# Patient Record
Sex: Male | Born: 1965 | Race: Black or African American | Hispanic: No | Marital: Married | Smoking: Never smoker
Health system: Southern US, Community
[De-identification: ages and names within clinical notes are randomized; demographics above are authoritative.]

## PROBLEM LIST (undated history)

## (undated) DIAGNOSIS — I10 Essential (primary) hypertension: Secondary | ICD-10-CM

## (undated) DIAGNOSIS — E119 Type 2 diabetes mellitus without complications: Secondary | ICD-10-CM

---

## 2012-04-15 DIAGNOSIS — M1710 Unilateral primary osteoarthritis, unspecified knee: Secondary | ICD-10-CM | POA: Insufficient documentation

## 2012-04-15 DIAGNOSIS — Z96649 Presence of unspecified artificial hip joint: Secondary | ICD-10-CM | POA: Insufficient documentation

## 2014-03-11 DIAGNOSIS — I10 Essential (primary) hypertension: Secondary | ICD-10-CM | POA: Insufficient documentation

## 2014-06-24 DIAGNOSIS — N529 Male erectile dysfunction, unspecified: Secondary | ICD-10-CM | POA: Insufficient documentation

## 2014-06-24 DIAGNOSIS — D1720 Benign lipomatous neoplasm of skin and subcutaneous tissue of unspecified limb: Secondary | ICD-10-CM | POA: Insufficient documentation

## 2015-01-18 DIAGNOSIS — N184 Chronic kidney disease, stage 4 (severe): Secondary | ICD-10-CM | POA: Insufficient documentation

## 2015-05-07 DIAGNOSIS — E66811 Obesity, class 1: Secondary | ICD-10-CM | POA: Diagnosis present

## 2015-05-07 DIAGNOSIS — E669 Obesity, unspecified: Secondary | ICD-10-CM | POA: Diagnosis present

## 2015-08-25 DIAGNOSIS — E785 Hyperlipidemia, unspecified: Secondary | ICD-10-CM | POA: Diagnosis present

## 2015-08-25 DIAGNOSIS — R739 Hyperglycemia, unspecified: Secondary | ICD-10-CM | POA: Diagnosis present

## 2015-12-06 DIAGNOSIS — E1121 Type 2 diabetes mellitus with diabetic nephropathy: Secondary | ICD-10-CM | POA: Insufficient documentation

## 2016-03-24 DIAGNOSIS — K219 Gastro-esophageal reflux disease without esophagitis: Secondary | ICD-10-CM | POA: Diagnosis present

## 2016-03-28 DIAGNOSIS — IMO0002 Reserved for concepts with insufficient information to code with codable children: Secondary | ICD-10-CM | POA: Insufficient documentation

## 2016-03-28 DIAGNOSIS — H35033 Hypertensive retinopathy, bilateral: Secondary | ICD-10-CM | POA: Insufficient documentation

## 2016-09-07 DIAGNOSIS — I10 Essential (primary) hypertension: Secondary | ICD-10-CM | POA: Diagnosis present

## 2017-03-27 DIAGNOSIS — E876 Hypokalemia: Secondary | ICD-10-CM | POA: Insufficient documentation

## 2017-07-30 DIAGNOSIS — R0683 Snoring: Secondary | ICD-10-CM | POA: Insufficient documentation

## 2017-09-14 DIAGNOSIS — R0681 Apnea, not elsewhere classified: Secondary | ICD-10-CM | POA: Insufficient documentation

## 2017-11-08 DIAGNOSIS — G4733 Obstructive sleep apnea (adult) (pediatric): Secondary | ICD-10-CM | POA: Diagnosis present

## 2017-12-17 DIAGNOSIS — B351 Tinea unguium: Secondary | ICD-10-CM | POA: Insufficient documentation

## 2020-06-11 DIAGNOSIS — H6122 Impacted cerumen, left ear: Secondary | ICD-10-CM | POA: Insufficient documentation

## 2020-11-08 DIAGNOSIS — F410 Panic disorder [episodic paroxysmal anxiety] without agoraphobia: Secondary | ICD-10-CM | POA: Insufficient documentation

## 2021-01-25 ENCOUNTER — Emergency Department (HOSPITAL_BASED_OUTPATIENT_CLINIC_OR_DEPARTMENT_OTHER): Payer: Medicare Other

## 2021-01-25 ENCOUNTER — Observation Stay (HOSPITAL_BASED_OUTPATIENT_CLINIC_OR_DEPARTMENT_OTHER)
Admission: EM | Admit: 2021-01-25 | Discharge: 2021-01-27 | Disposition: A | Discharge status: Home / Self Care | Payer: Medicare Other | Attending: Student | Admitting: Student

## 2021-01-25 ENCOUNTER — Other Ambulatory Visit: Payer: Self-pay

## 2021-01-25 ENCOUNTER — Encounter (HOSPITAL_BASED_OUTPATIENT_CLINIC_OR_DEPARTMENT_OTHER): Payer: Self-pay

## 2021-01-25 DIAGNOSIS — R748 Abnormal levels of other serum enzymes: Secondary | ICD-10-CM

## 2021-01-25 DIAGNOSIS — R1013 Epigastric pain: Secondary | ICD-10-CM | POA: Diagnosis present

## 2021-01-25 DIAGNOSIS — K7589 Other specified inflammatory liver diseases: Secondary | ICD-10-CM | POA: Insufficient documentation

## 2021-01-25 DIAGNOSIS — K8012 Calculus of gallbladder with acute and chronic cholecystitis without obstruction: Secondary | ICD-10-CM | POA: Diagnosis not present

## 2021-01-25 DIAGNOSIS — K760 Fatty (change of) liver, not elsewhere classified: Secondary | ICD-10-CM

## 2021-01-25 DIAGNOSIS — Z96643 Presence of artificial hip joint, bilateral: Secondary | ICD-10-CM | POA: Diagnosis not present

## 2021-01-25 DIAGNOSIS — R739 Hyperglycemia, unspecified: Secondary | ICD-10-CM | POA: Diagnosis present

## 2021-01-25 DIAGNOSIS — Z20822 Contact with and (suspected) exposure to covid-19: Secondary | ICD-10-CM | POA: Diagnosis not present

## 2021-01-25 DIAGNOSIS — K810 Acute cholecystitis: Secondary | ICD-10-CM | POA: Diagnosis present

## 2021-01-25 DIAGNOSIS — I129 Hypertensive chronic kidney disease with stage 1 through stage 4 chronic kidney disease, or unspecified chronic kidney disease: Secondary | ICD-10-CM | POA: Insufficient documentation

## 2021-01-25 DIAGNOSIS — N184 Chronic kidney disease, stage 4 (severe): Secondary | ICD-10-CM | POA: Insufficient documentation

## 2021-01-25 DIAGNOSIS — K219 Gastro-esophageal reflux disease without esophagitis: Secondary | ICD-10-CM | POA: Diagnosis not present

## 2021-01-25 DIAGNOSIS — E1122 Type 2 diabetes mellitus with diabetic chronic kidney disease: Secondary | ICD-10-CM | POA: Diagnosis not present

## 2021-01-25 DIAGNOSIS — I10 Essential (primary) hypertension: Secondary | ICD-10-CM

## 2021-01-25 DIAGNOSIS — K8000 Calculus of gallbladder with acute cholecystitis without obstruction: Secondary | ICD-10-CM | POA: Diagnosis not present

## 2021-01-25 DIAGNOSIS — E1165 Type 2 diabetes mellitus with hyperglycemia: Secondary | ICD-10-CM | POA: Diagnosis not present

## 2021-01-25 DIAGNOSIS — Z7982 Long term (current) use of aspirin: Secondary | ICD-10-CM | POA: Insufficient documentation

## 2021-01-25 DIAGNOSIS — Z888 Allergy status to other drugs, medicaments and biological substances status: Secondary | ICD-10-CM | POA: Insufficient documentation

## 2021-01-25 DIAGNOSIS — R1011 Right upper quadrant pain: Secondary | ICD-10-CM

## 2021-01-25 DIAGNOSIS — E1121 Type 2 diabetes mellitus with diabetic nephropathy: Secondary | ICD-10-CM

## 2021-01-25 DIAGNOSIS — E785 Hyperlipidemia, unspecified: Secondary | ICD-10-CM | POA: Diagnosis present

## 2021-01-25 DIAGNOSIS — E669 Obesity, unspecified: Secondary | ICD-10-CM | POA: Diagnosis present

## 2021-01-25 DIAGNOSIS — Z79899 Other long term (current) drug therapy: Secondary | ICD-10-CM | POA: Insufficient documentation

## 2021-01-25 DIAGNOSIS — G4733 Obstructive sleep apnea (adult) (pediatric): Secondary | ICD-10-CM | POA: Diagnosis present

## 2021-01-25 HISTORY — DX: Essential (primary) hypertension: I10

## 2021-01-25 HISTORY — DX: Type 2 diabetes mellitus without complications: E11.9

## 2021-01-25 LAB — COMPREHENSIVE METABOLIC PANEL
ALT: 40 U/L (ref 0–44)
AST: 29 U/L (ref 15–41)
Albumin: 4.5 g/dL (ref 3.5–5.0)
Alkaline Phosphatase: 66 U/L (ref 38–126)
Anion gap: 10 (ref 5–15)
BUN: 23 mg/dL — ABNORMAL HIGH (ref 6–20)
CO2: 25 mmol/L (ref 22–32)
Calcium: 9.2 mg/dL (ref 8.9–10.3)
Chloride: 102 mmol/L (ref 98–111)
Creatinine, Ser: 1.91 mg/dL — ABNORMAL HIGH (ref 0.61–1.24)
GFR, Estimated: 41 mL/min — ABNORMAL LOW (ref >60)
Glucose, Bld: 158 mg/dL — ABNORMAL HIGH (ref 70–99)
Potassium: 3.8 mmol/L (ref 3.5–5.1)
Sodium: 137 mmol/L (ref 135–145)
Total Bilirubin: 0.6 mg/dL (ref 0.3–1.2)
Total Protein: 8.2 g/dL — ABNORMAL HIGH (ref 6.5–8.1)

## 2021-01-25 LAB — CBC
HCT: 43.7 % (ref 39.0–52.0)
Hemoglobin: 14.5 g/dL (ref 13.0–17.0)
MCH: 29.1 pg (ref 26.0–34.0)
MCHC: 33.2 g/dL (ref 30.0–36.0)
MCV: 87.8 fL (ref 80.0–100.0)
Platelets: 194 10*3/uL (ref 150–400)
RBC: 4.98 MIL/uL (ref 4.22–5.81)
RDW: 14.7 % (ref 11.5–15.5)
WBC: 5.4 10*3/uL (ref 4.0–10.5)
nRBC: 0 % (ref 0.0–0.2)

## 2021-01-25 LAB — LACTIC ACID, PLASMA: Lactic Acid, Venous: 1 mmol/L (ref 0.5–1.9)

## 2021-01-25 LAB — URINALYSIS, MICROSCOPIC (REFLEX)

## 2021-01-25 LAB — URINALYSIS, ROUTINE W REFLEX MICROSCOPIC
Bilirubin Urine: NEGATIVE
Glucose, UA: 100 mg/dL — AB
Ketones, ur: NEGATIVE mg/dL
Leukocytes,Ua: NEGATIVE
Nitrite: NEGATIVE
Protein, ur: 30 mg/dL — AB
Specific Gravity, Urine: 1.02 (ref 1.005–1.030)
pH: 8 (ref 5.0–8.0)

## 2021-01-25 LAB — RESP PANEL BY RT-PCR (FLU A&B, COVID) ARPGX2
Influenza A by PCR: NEGATIVE
Influenza B by PCR: NEGATIVE
SARS Coronavirus 2 by RT PCR: NEGATIVE

## 2021-01-25 LAB — LIPASE, BLOOD: Lipase: 58 U/L — ABNORMAL HIGH (ref 11–51)

## 2021-01-25 LAB — TROPONIN I (HIGH SENSITIVITY)
Troponin I (High Sensitivity): 10 ng/L (ref <18)
Troponin I (High Sensitivity): 10 ng/L (ref <18)

## 2021-01-25 LAB — CBG MONITORING, ED
Glucose-Capillary: 114 mg/dL — ABNORMAL HIGH (ref 70–99)
Glucose-Capillary: 110 mg/dL — ABNORMAL HIGH (ref 70–99)

## 2021-01-25 MED ORDER — PANTOPRAZOLE SODIUM 40 MG PO TBEC
40.0000 mg | DELAYED_RELEASE_TABLET | Freq: Every day | ORAL | Status: DC
  Administered 2021-01-26 – 2021-01-27 (×2): 40 mg via ORAL
  Filled 2021-01-25 (×2): qty 1

## 2021-01-25 MED ORDER — AMLODIPINE BESYLATE 10 MG PO TABS
10.0000 mg | ORAL_TABLET | Freq: Every day | ORAL | Status: DC
  Administered 2021-01-26 – 2021-01-27 (×2): 10 mg via ORAL
  Filled 2021-01-25 (×2): qty 1

## 2021-01-25 MED ORDER — AMILORIDE HCL 5 MG PO TABS
5.0000 mg | ORAL_TABLET | Freq: Every day | ORAL | Status: DC
  Administered 2021-01-25 – 2021-01-27 (×2): 5 mg via ORAL
  Filled 2021-01-25 (×3): qty 1

## 2021-01-25 MED ORDER — FAMOTIDINE IN NACL 20-0.9 MG/50ML-% IV SOLN
20.0000 mg | Freq: Once | INTRAVENOUS | Status: AC
  Administered 2021-01-25: 20 mg via INTRAVENOUS
  Filled 2021-01-25: qty 50

## 2021-01-25 MED ORDER — ONDANSETRON HCL 4 MG/2ML IJ SOLN
4.0000 mg | Freq: Four times a day (QID) | INTRAMUSCULAR | Status: DC | PRN
  Administered 2021-01-26: 4 mg via INTRAVENOUS
  Filled 2021-01-25: qty 2

## 2021-01-25 MED ORDER — HYDROMORPHONE HCL 1 MG/ML IJ SOLN
0.5000 mg | Freq: Once | INTRAMUSCULAR | Status: AC
  Administered 2021-01-25: 0.5 mg via INTRAVENOUS
  Filled 2021-01-25: qty 1

## 2021-01-25 MED ORDER — LABETALOL HCL 300 MG PO TABS: 300.0000 mg | ORAL_TABLET | Freq: Three times a day (TID) | ORAL | Status: DC

## 2021-01-25 MED ORDER — LABETALOL HCL 5 MG/ML IV SOLN
10.0000 mg | Freq: Once | INTRAVENOUS | Status: AC
  Administered 2021-01-25: 10 mg via INTRAVENOUS

## 2021-01-25 MED ORDER — IOHEXOL 300 MG/ML  SOLN
100.0000 mL | Freq: Once | INTRAMUSCULAR | Status: AC | PRN
  Administered 2021-01-25: 80 mL via INTRAVENOUS

## 2021-01-25 MED ORDER — SODIUM CHLORIDE 0.9% FLUSH
3.0000 mL | Freq: Two times a day (BID) | INTRAVENOUS | Status: DC
  Administered 2021-01-26 – 2021-01-27 (×2): 3 mL via INTRAVENOUS

## 2021-01-25 MED ORDER — LABETALOL HCL 300 MG PO TABS: 300.0000 mg | ORAL_TABLET | Freq: Two times a day (BID) | ORAL | Status: DC

## 2021-01-25 MED ORDER — SODIUM CHLORIDE 0.9 % IV SOLN
2.0000 g | INTRAVENOUS | Status: DC
  Administered 2021-01-25 – 2021-01-26 (×2): 2 g via INTRAVENOUS
  Filled 2021-01-25: qty 20
  Filled 2021-01-25: qty 0.2

## 2021-01-25 MED ORDER — SODIUM CHLORIDE 0.9 % IV BOLUS
1000.0000 mL | Freq: Once | INTRAVENOUS | Status: AC
  Administered 2021-01-25: 1000 mL via INTRAVENOUS

## 2021-01-25 MED ORDER — LABETALOL HCL 5 MG/ML IV SOLN
INTRAVENOUS | Status: AC
  Filled 2021-01-25: qty 4

## 2021-01-25 MED ORDER — SODIUM CHLORIDE 0.9 % IV SOLN: INTRAVENOUS | Status: DC

## 2021-01-25 MED ORDER — INSULIN ASPART 100 UNIT/ML ~~LOC~~ SOLN
0.0000 [IU] | SUBCUTANEOUS | Status: DC
  Filled 2021-01-25: qty 0.15

## 2021-01-25 MED ORDER — LISINOPRIL 20 MG PO TABS: 40.0000 mg | ORAL_TABLET | Freq: Every day | ORAL | Status: DC

## 2021-01-25 MED ORDER — LIDOCAINE VISCOUS HCL 2 % MT SOLN
15.0000 mL | Freq: Once | OROMUCOSAL | Status: AC
  Administered 2021-01-25: 15 mL via ORAL
  Filled 2021-01-25: qty 15

## 2021-01-25 MED ORDER — HYDRALAZINE HCL 20 MG/ML IJ SOLN: 5.0000 mg | Freq: Three times a day (TID) | INTRAMUSCULAR | Status: DC | PRN

## 2021-01-25 MED ORDER — LABETALOL HCL 300 MG PO TABS
300.0000 mg | ORAL_TABLET | Freq: Two times a day (BID) | ORAL | Status: DC
  Administered 2021-01-25 – 2021-01-27 (×4): 300 mg via ORAL
  Filled 2021-01-25 (×4): qty 1

## 2021-01-25 MED ORDER — HYDROMORPHONE HCL 1 MG/ML IJ SOLN
0.5000 mg | INTRAMUSCULAR | Status: DC | PRN
  Administered 2021-01-26: 0.5 mg via INTRAVENOUS
  Filled 2021-01-25: qty 0.5

## 2021-01-25 MED ORDER — AMILORIDE HCL 5 MG PO TABS: 5.0000 mg | ORAL_TABLET | Freq: Every day | ORAL | Status: DC

## 2021-01-25 MED ORDER — AMLODIPINE BESYLATE 5 MG PO TABS: 10.0000 mg | ORAL_TABLET | Freq: Every day | ORAL | Status: DC

## 2021-01-25 MED ORDER — LISINOPRIL 20 MG PO TABS
40.0000 mg | ORAL_TABLET | Freq: Every day | ORAL | Status: DC
  Administered 2021-01-26 – 2021-01-27 (×2): 40 mg via ORAL
  Filled 2021-01-25 (×2): qty 2

## 2021-01-25 MED ORDER — ROSUVASTATIN CALCIUM 10 MG PO TABS
10.0000 mg | ORAL_TABLET | Freq: Every day | ORAL | Status: DC
  Administered 2021-01-25 – 2021-01-27 (×3): 10 mg via ORAL
  Filled 2021-01-25 (×3): qty 1

## 2021-01-25 MED ORDER — ALUM & MAG HYDROXIDE-SIMETH 200-200-20 MG/5ML PO SUSP
30.0000 mL | Freq: Once | ORAL | Status: AC
  Administered 2021-01-25: 30 mL via ORAL
  Filled 2021-01-25: qty 30

## 2021-01-25 MED ORDER — ONDANSETRON HCL 4 MG/2ML IJ SOLN
4.0000 mg | Freq: Once | INTRAMUSCULAR | Status: AC
  Administered 2021-01-25: 4 mg via INTRAVENOUS
  Filled 2021-01-25: qty 2

## 2021-01-25 MED ORDER — MORPHINE SULFATE (PF) 4 MG/ML IV SOLN
4.0000 mg | Freq: Once | INTRAVENOUS | Status: AC
  Administered 2021-01-25: 4 mg via INTRAVENOUS
  Filled 2021-01-25: qty 1

## 2021-01-25 MED ORDER — METRONIDAZOLE IN NACL 5-0.79 MG/ML-% IV SOLN
500.0000 mg | Freq: Three times a day (TID) | INTRAVENOUS | Status: DC
  Administered 2021-01-25 – 2021-01-27 (×4): 500 mg via INTRAVENOUS
  Filled 2021-01-25 (×5): qty 100

## 2021-01-25 NOTE — ED Notes (Signed)
Pt ambulatory to restroom to obtain urinalysis

## 2021-01-25 NOTE — ED Provider Notes (Signed)
Patient transferred from [facility] for evaluation by general surgery.  HPI from [facility] PA-C: "Patient is 55 year old male with past medical history significant for DM 2, HTN  Patient is presented today with severe 10/10 epigastric abdominal pain that he states is without any aggravating or mitigating factors.  He states that his symptoms began this morning when he woke up several hours ago and seems to have worsened since that time.  He states he has had one episode in his history where he had similar symptoms and went to the emergency room and was told that he had reflux disease.  He states it is epigastric and severe.  Does not seem to radiate anywhere.  Denies any chest pain or shortness of breath no lightheadedness or dizziness denies any black or tarry stools, denies any pleuritic component to the abdominal pain and does not seem to be exertional.  He had not eaten anything today when the epigastric pain began and he states that he vomited 3 times which was nonbloody nonbilious.  Denies any fevers or chills no sick contacts, diarrhea or constipation.  No urinary symptoms such as burning with urination, frequency urgency or hematuria.  No recent surgeries, hospitalization, long travel, hemoptysis, estrogen containing OCP, cancer history.  No unilateral leg swelling.  No history of PE or VTE.  Patient does state that he has a history of gallstones."  Abnormal Labs Reviewed  LIPASE, BLOOD - Abnormal; Notable for the following components:      Result Value   Lipase 58 (*)    All other components within normal limits  COMPREHENSIVE METABOLIC PANEL - Abnormal; Notable for the following components:   Glucose, Bld 158 (*)    BUN 23 (*)    Creatinine, Ser 1.91 (*)    Total Protein 8.2 (*)    GFR, Estimated 41 (*)    All other components within normal limits  URINALYSIS, ROUTINE W REFLEX MICROSCOPIC - Abnormal; Notable for the following components:   Glucose, UA 100  (*)    Hgb urine dipstick SMALL (*)    Protein, ur 30 (*)    All other components within normal limits  URINALYSIS, MICROSCOPIC (REFLEX) - Abnormal; Notable for the following components:   Bacteria, UA FEW (*)    All other components within normal limits  CBG MONITORING, ED - Abnormal; Notable for the following components:   Glucose-Capillary 114 (*)    All other components within normal limits    CT ABDOMEN PELVIS W CONTRAST  Result Date: 01/25/2021 CLINICAL DATA:  Epigastric pain/abdominal pain. Concern for pancreatitis. EXAM: CT ABDOMEN AND PELVIS WITH CONTRAST TECHNIQUE: Multidetector CT imaging of the abdomen and pelvis was performed using the standard protocol following bolus administration of intravenous contrast. CONTRAST:  48mL OMNIPAQUE IOHEXOL 300 MG/ML  SOLN COMPARISON:  06/25/2019 FINDINGS: Lower chest: Linear scarring at the left base. Heart is borderline in size. No effusions. Hepatobiliary: Cholelithiasis.  No focal hepatic abnormality. Pancreas: No focal abnormality or ductal dilatation. No surrounding inflammation. Spleen: No focal abnormality.  Normal size. Adrenals/Urinary Tract: No adrenal abnormality. No focal renal abnormality. No stones or hydronephrosis. Urinary bladder is unremarkable. Stomach/Bowel: Normal appendix. Stomach, large and small bowel grossly unremarkable. Vascular/Lymphatic: Aortic atherosclerosis. No evidence of aneurysm or adenopathy. Reproductive: Lower pelvic structures obscured by beam hardening artifact from bilateral hip replacements. Other: No free fluid or free air. Musculoskeletal: Bilateral hip replacements. No acute bony abnormality. Lucent lesion within the left iliac bone with surrounding sclerosis is again noted  and unchanged since prior study. This has a benign appearance. IMPRESSION: Cholelithiasis. No CT evidence of pancreatitis. Aortic atherosclerosis.  Borderline cardiomegaly. No acute findings. Electronically Signed   By: Rolm Baptise M.D.    On: 01/25/2021 14:58   DG Chest Port 1 View  Result Date: 01/25/2021 CLINICAL DATA:  Chest pain EXAM: PORTABLE CHEST 1 VIEW COMPARISON:  06/25/2019 FINDINGS: Cardiac enlargement. Negative for heart failure or edema. No significant pleural effusion. Negative for pneumonia. IMPRESSION: Cardiac enlargement.  No acute abnormality. Electronically Signed   By: Franchot Gallo M.D.   On: 01/25/2021 12:33   US Abdomen Limited RUQ (LIVER/GB)  Result Date: 01/25/2021 CLINICAL DATA:  Right upper abdominal pain with nausea and vomiting EXAM: ULTRASOUND ABDOMEN LIMITED RIGHT UPPER QUADRANT COMPARISON:  CT abdomen and pelvis June 25, 2019 FINDINGS: Gallbladder: Within the gallbladder, there are echogenic foci which move and shadow consistent with cholelithiasis. Largest gallstone measures 1.6 cm in length. Gallbladder wall thickness is upper normal. No pericholecystic fluid. Patient is focally tender over the gallbladder. Common bile duct: Diameter: 3 mm. No intrahepatic or extrahepatic biliary duct dilatation. Liver: No focal lesion identified. Within normal limits in parenchymal echogenicity. Portal vein is patent on color Doppler imaging with normal direction of blood flow towards the liver. Other: None. IMPRESSION: Cholelithiasis with gallbladder wall thickness upper normal. Patient is focally tender over the gallbladder. These findings raise concern for potential degree of acute cholecystitis. This finding may warrant nuclear medicine hepatobiliary gene study to assess for cystic duct patency. Study otherwise unremarkable. Electronically Signed   By: Lowella Grip III M.D.   On: 01/25/2021 11:53    Clinical Course as of 01/25/21 2001  Tue Jan 25, 2021  1106 Patient initially seen at 32:44 AM. Severe colicky epigastric abdominal pain.  No history peptic ulcer disease and no melena or hematochezia making this less likely.  Seems to be relatively sudden onset no history of arrhythmia I have low suspicion for  mesenteric ischemia however will add lactic acid and to work-up.  He has no history of DKA. No history of pulmonary edema and he denies any chest pain or shortness of breath or diaphoresis.  He has had nausea and 3 episodes of vomiting today.  He states he had a similar episode in the past and was told he had acid reflux.  He states that this time his pain seems more severe however.  No history of cardiac disease or heart attack.  EKG is somewhat abnormal with no comparison he does have T wave inversions inferior leads and V6 discussed them I did not physician.  The lateral troponin. Given the patient does have severe epigastric pain and history of gallstones I do have some concern for choledocholithiasis versus gallstone pancreatitis.  Analgesia in the form of Dilaudid provided.  We will also provide Pepcid and Zofran for nausea as well as fluids. [WF]  0102 Patient reassessed just prior to discharge.  EMTALA form was completed.  Dr. Billy Fischer will be ED receiving physician I discussed with earlier.  Dr. Johney Maine will need to be consulted once patient arrives in the ER.  Covid swab is negative. [WF]  7253 Just prior to discharge patient blood pressure was elevated approximately 200/110.  Will provide with 10 of labetalol. [WF]  1825 Patient arrived from [facility].  Pain is 2/10.  Denies current nausea.  Denies additional complaints. [SJ]  [place] with Dr. Ninfa Linden, general surgery.  Requests that we admit the patient via hospitalist.  He will come see the patient and decide on further management, including decision on antibiotics and oral medications. [SJ]  1953 Spoke with Dr. Trilby Drummer, hospitalist.  Agrees to admit the patient. [SJ]    Clinical Course User Index [SJ] Juanito Gonyer, Helane Gunther, PA-C [WF] Tedd Sias, [place]    Patient presents as a transfer from one of our other facilities.  He is hypertensive, however, there is concern for cholecystitis, therefore we hesitated to introduce his  oral medications in an effort to keep him NPO.   Patient's pulse rate was already in the 50s upon his arrival, therefore, I thought it prudent to avoid more beta-blocker. IV hydralazine on national back order.  He does not seem to be symptomatic to his hypertension.      Layla Maw 01/25/21 2037    Little, Wenda Overland, MD 01/25/21 [phone]

## 2021-01-25 NOTE — Consult Note (Signed)
Reason for Consult:cholecystitis Referring Physician: Dr. Earley Favor   Adrian Mckenzie is an 55 y.o. male.  HPI: This is a 55 year old gentleman transferred from [place].  He developed the sudden onset of severe epigastric abdominal pain around 5 AM.  He described it as sharp.  He developed nausea and vomiting with this.  He reports that he has had milder attacks in the past but none this severe.  He recently found out he had gallstones.  He underwent an ultrasound showing gallstones with mild gallbladder wall thickening.  There was no pericholecystic fluid but he had a positive Murphy sign.  CAT scan of the abdomen pelvis was otherwise unremarkable.  He had a normal WBC and normal LFTs.  His lipase was slightly elevated.  Bowel movements have been normal.  He denies fevers or chills.  His pain has improved fairly significantly with pain medications.  He was found to be significantly hypertensive with the highest blood pressure reading 210/110.  He also has chronic renal insufficiency, type 2 diabetes, and OSA.  Past Medical History:  Diagnosis Date  . Diabetes mellitus without complication ([place])   . Hypertension    Patient Active Problem List   Diagnosis Date Noted  . Acute calculous cholecystitis 01/25/2021  . Panic disorder 11/08/2020  . Impacted cerumen of left ear 06/11/2020  . Onychomycosis of toenail 12/17/2017  . OSA (obstructive sleep apnea) 11/08/2017  . Witnessed apneic spells 09/14/2017  . Snoring 07/30/2017  . Hypokalemia 03/27/2017  . Hypertension, accelerated 09/07/2016  . Hypertensive retinopathy of both eyes 03/28/2016  . Nuclear cataract of both eyes 03/28/2016  . Gastroesophageal reflux disease without esophagitis 03/24/2016  . Type 2 diabetes mellitus with diabetic nephropathy, without long-term current use of insulin ([place]) 12/06/2015  . Hyperglycemia 08/25/2015  . Hyperlipidemia 08/25/2015  . Obesity (BMI 30.0-34.9) 05/07/2015  . CKD (chronic kidney  disease) stage 4, GFR 15-29 ml/min (HCC) 01/18/2015  . 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 Lipoma of forearm 06/24/2014  . Essential hypertension 03/11/2014  . 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 Osteoarthritis of knee 04/15/2012    History reviewed. No pertinent surgical history.  History reviewed. No pertinent family history.  Social History:  reports that he has never smoked. He has never used smokeless tobacco. He reports current alcohol use. He reports that he does not use drugs.  Allergies:  Allergies  Allergen Reactions  . Atorvastatin Other (See Comments)  . Hydralazine Other (See Comments)    Fatigue.    Medications: I have reviewed the patient's current medications.  Results for orders placed or performed during the hospital encounter of 01/25/21 (from the past 48 hour(s))  Troponin I (High Sensitivity)     Status: None   Collection Time: 01/25/21 10:52 AM  Result Value Ref Range   Troponin I (High Sensitivity) 10 <18 ng/L    Comment: (NOTE) Elevated high sensitivity troponin I (hsTnI) values and significant  changes across serial measurements may suggest ACS but many other  chronic and acute conditions are known to elevate hsTnI results.  Refer to the "Links" section for chest pain algorithms and additional  guidance. Performed at [facility], [address] [place], [place] [address]   Lipase, blood     Status: Abnormal   Collection Time: 01/25/21 10:56 AM  Result Value Ref Range   Lipase 58 (H) 11 - 51 U/L    Comment: Performed at [facility], [address] [place], [place] [address]  Comprehensive metabolic panel     Status: Abnormal   Collection Time: 01/25/21 10:56 AM  Result Value Ref Range   Sodium 137 135 - 145 mmol/L   Potassium 3.8 3.5 - 5.1 mmol/L   Chloride 102 98 - 111 mmol/L   CO2 25 22 - 32 mmol/L   Glucose, Bld 158 (H) 70 - 99 mg/dL    Comment: Glucose reference range applies only  to samples taken after fasting for at least 8 hours.   BUN 23 (H) 6 - 20 mg/dL   Creatinine, Ser 1.91 (H) 0.61 - 1.24 mg/dL   Calcium 9.2 8.9 - 10.3 mg/dL   Total Protein 8.2 (H) 6.5 - 8.1 g/dL   Albumin 4.5 3.5 - 5.0 g/dL   AST 29 15 - 41 U/L   ALT 40 0 - 44 U/L   Alkaline Phosphatase 66 38 - 126 U/L   Total Bilirubin 0.6 0.3 - 1.2 mg/dL   GFR, Estimated 41 (L) >60 mL/min    Comment: (NOTE) Calculated using the CKD-EPI Creatinine Equation (2021)    Anion gap 10 5 - 15    Comment: Performed at [facility], [address] [place], [place] [address]  CBC     Status: None   Collection Time: 01/25/21 10:56 AM  Result Value Ref Range   WBC 5.4 4.0 - 10.5 K/uL   RBC 4.98 4.22 - 5.81 MIL/uL   Hemoglobin 14.5 13.0 - 17.0 g/dL   HCT 43.7 39.0 - 52.0 %   MCV 87.8 80.0 - 100.0 fL   MCH 29.1 26.0 - 34.0 pg   MCHC 33.2 30.0 - 36.0 g/dL   RDW 14.7 11.5 - 15.5 %   Platelets 194 150 - 400 K/uL   nRBC 0.0 0.0 - 0.2 %    Comment: Performed at [facility], [address] [place], [place] [address]  Urinalysis, Routine w reflex microscopic Urine, Clean Catch     Status: Abnormal   Collection Time: 01/25/21 10:56 AM  Result Value Ref Range   Color, Urine YELLOW YELLOW   APPearance CLEAR CLEAR   Specific Gravity, Urine 1.020 1.005 - 1.030   pH 8.0 5.0 - 8.0   Glucose, UA 100 (A) NEGATIVE mg/dL   Hgb urine dipstick SMALL (A) NEGATIVE   Bilirubin Urine NEGATIVE NEGATIVE   Ketones, ur NEGATIVE NEGATIVE mg/dL   Protein, ur 30 (A) NEGATIVE mg/dL   Nitrite NEGATIVE NEGATIVE   Leukocytes,Ua NEGATIVE NEGATIVE    Comment: Performed at [facility], [address] [place], [place] [address]  Urinalysis, Microscopic (reflex)     Status: Abnormal   Collection Time: 01/25/21 10:56 AM  Result Value Ref Range   RBC / HPF 0-5 0 - 5 RBC/hpf   WBC, UA 0-5 0 - 5 WBC/hpf   Bacteria, UA FEW (A) NONE SEEN   Squamous Epithelial / LPF 0-5 0 - 5    Comment: Performed at [facility], [address] [place], [place] [address]  Lactic acid, plasma     Status: None   Collection Time: 01/25/21 12:17 PM  Result Value Ref Range   Lactic Acid, Venous 1.0 0.5 - 1.9 mmol/L    Comment: Performed at [facility], [address] [place], [place] [address]  Troponin I (High Sensitivity)     Status: None   Collection Time: 01/25/21  1:17  PM  Result Value Ref Range   Troponin I (High Sensitivity) 10 <18 ng/L    Comment: (NOTE) Elevated high sensitivity troponin I (hsTnI) values and significant  changes across serial measurements may suggest ACS but many other  chronic and acute conditions are known to elevate hsTnI results.  Refer to the "Links" section for chest pain algorithms and additional  guidance. Performed at [facility], [address] [place], [place] [address]   Resp Panel by RT-PCR (Flu A&B, Covid) Nasopharyngeal Swab     Status: None   Collection Time: 01/25/21  3:40 PM   Specimen: Nasopharyngeal Swab; Nasopharyngeal(NP) swabs in vial transport medium  Result Value Ref Range   SARS Coronavirus 2 by RT PCR NEGATIVE NEGATIVE    Comment: (NOTE) SARS-CoV-2 target nucleic acids are NOT DETECTED.  The SARS-CoV-2 RNA is generally detectable in upper respiratory specimens during the acute phase of infection. The lowest concentration of SARS-CoV-2 viral copies this assay can detect is 138 copies/mL. A negative result does not preclude SARS-Cov-2 infection and should not be used as the sole basis for treatment or other patient management decisions. A negative result may occur with  improper specimen collection/handling, submission of specimen other than nasopharyngeal swab, presence of viral mutation(s) within the areas targeted by this assay, and inadequate number of viral copies(<138 copies/mL). A negative result must be combined with clinical observations, patient history, and epidemiological information. The expected  result is Negative.  Fact Sheet for Patients:  EntrepreneurPulse.com.au  Fact Sheet for Healthcare Providers:  IncredibleEmployment.be  This test is no t yet approved or cleared by the [place] FDA and  has been authorized for detection and/or diagnosis of SARS-CoV-2 by FDA under an Emergency Use Authorization (EUA). This EUA will remain  in effect (meaning this test can be used) for the duration of the COVID-19 declaration under Section 564(b)(1) of the Act, 21 U.S.C.section 360bbb-3(b)(1), unless the authorization is terminated  or revoked sooner.       Influenza A by PCR NEGATIVE NEGATIVE   Influenza B by PCR NEGATIVE NEGATIVE    Comment: (NOTE) The Xpert Xpress SARS-CoV-2/FLU/RSV plus assay is intended as an aid in the diagnosis of influenza from Nasopharyngeal swab specimens and should not be used as a sole basis for treatment. Nasal washings and aspirates are unacceptable for Xpert Xpress SARS-CoV-2/FLU/RSV testing.  Fact Sheet for Patients: EntrepreneurPulse.com.au  Fact Sheet for Healthcare Providers: IncredibleEmployment.be  This test is not yet approved or cleared by the [place] FDA and has been authorized for detection and/or diagnosis of SARS-CoV-2 by FDA under an Emergency Use Authorization (EUA). This EUA will remain in effect (meaning this test can be used) for the duration of the COVID-19 declaration under Section 564(b)(1) of the Act, 21 U.S.C. section 360bbb-3(b)(1), unless the authorization is terminated or revoked.  Performed at [facility], [address] [place], [place] [address]   CBG monitoring, ED     Status: Abnormal   Collection Time: 01/25/21  4:05 PM  Result Value Ref Range   Glucose-Capillary 114 (H) 70 - 99 mg/dL    Comment: Glucose reference range applies only to samples taken after fasting for at least 8 hours.    CT ABDOMEN PELVIS W  CONTRAST  Result Date: 01/25/2021 CLINICAL DATA:  Epigastric pain/abdominal pain. Concern for pancreatitis. EXAM: CT ABDOMEN AND PELVIS WITH CONTRAST TECHNIQUE: Multidetector CT imaging of the abdomen and pelvis was performed using the standard protocol following bolus  administration of intravenous contrast. CONTRAST:  1mL OMNIPAQUE IOHEXOL 300 MG/ML  SOLN COMPARISON:  06/25/2019 FINDINGS: Lower chest: Linear scarring at the left base. Heart is borderline in size. No effusions. Hepatobiliary: Cholelithiasis.  No focal hepatic abnormality. Pancreas: No focal abnormality or ductal dilatation. No surrounding inflammation. Spleen: No focal abnormality.  Normal size. Adrenals/Urinary Tract: No adrenal abnormality. No focal renal abnormality. No stones or hydronephrosis. Urinary bladder is unremarkable. Stomach/Bowel: Normal appendix. Stomach, large and small bowel grossly unremarkable. Vascular/Lymphatic: Aortic atherosclerosis. No evidence of aneurysm or adenopathy. Reproductive: Lower pelvic structures obscured by beam hardening artifact from bilateral hip replacements. Other: No free fluid or free air. Musculoskeletal: Bilateral hip replacements. No acute bony abnormality. Lucent lesion within the left iliac bone with surrounding sclerosis is again noted and unchanged since prior study. This has a benign appearance. IMPRESSION: Cholelithiasis. No CT evidence of pancreatitis. Aortic atherosclerosis.  Borderline cardiomegaly. No acute findings. Electronically Signed   By: Rolm Baptise M.D.   On: 01/25/2021 14:58   DG Chest Port 1 View  Result Date: 01/25/2021 CLINICAL DATA:  Chest pain EXAM: PORTABLE CHEST 1 VIEW COMPARISON:  06/25/2019 FINDINGS: Cardiac enlargement. Negative for heart failure or edema. No significant pleural effusion. Negative for pneumonia. IMPRESSION: Cardiac enlargement.  No acute abnormality. Electronically Signed   By: Franchot Gallo M.D.   On: 01/25/2021 12:33   US Abdomen Limited RUQ  (LIVER/GB)  Result Date: 01/25/2021 CLINICAL DATA:  Right upper abdominal pain with nausea and vomiting EXAM: ULTRASOUND ABDOMEN LIMITED RIGHT UPPER QUADRANT COMPARISON:  CT abdomen and pelvis June 25, 2019 FINDINGS: Gallbladder: Within the gallbladder, there are echogenic foci which move and shadow consistent with cholelithiasis. Largest gallstone measures 1.6 cm in length. Gallbladder wall thickness is upper normal. No pericholecystic fluid. Patient is focally tender over the gallbladder. Common bile duct: Diameter: 3 mm. No intrahepatic or extrahepatic biliary duct dilatation. Liver: No focal lesion identified. Within normal limits in parenchymal echogenicity. Portal vein is patent on color Doppler imaging with normal direction of blood flow towards the liver. Other: None. IMPRESSION: Cholelithiasis with gallbladder wall thickness upper normal. Patient is focally tender over the gallbladder. These findings raise concern for potential degree of acute cholecystitis. This finding may warrant nuclear medicine hepatobiliary gene study to assess for cystic duct patency. Study otherwise unremarkable. Electronically Signed   By: Lowella Grip III M.D.   On: 01/25/2021 11:53    Review of Systems  Constitutional: Negative for chills and fever.  Respiratory: Negative for cough and shortness of breath.   Gastrointestinal: Positive for abdominal pain, nausea and vomiting.  Genitourinary: Negative for dysuria.   Blood pressure (!) 185/103, pulse (!) 55, temperature 97.6 F (36.4 C), temperature source Oral, resp. rate 15, height 6\' 4"  (1.93 m), weight 127 kg, SpO2 98 %. Physical Exam Constitutional:      General: He is not in acute distress.    Appearance: He is obese.     Comments: He is a little anxious but more comfortable  Eyes:     General: No scleral icterus. Cardiovascular:     Rate and Rhythm: Normal rate and regular rhythm.  Pulmonary:     Effort: Pulmonary effort is normal.     Breath  sounds: Normal breath sounds.  Abdominal:     Comments: His abdomen is obese.  There is moderate tenderness with guarding in the epigastrium and a little less so in the right upper quadrant.  There are no hernias.  There is no hepatomegaly  Skin:    General: Skin is warm and dry.  Neurological:     Mental Status: He is alert.  Psychiatric:        Behavior: Behavior normal.     Assessment/Plan: Cholelithiasis with cholecystitis and mild pancreatitis  I discussed the diagnosis with the patient and his family.  It is recommended that he be admitted to the hospital for IV rehydration, IV antibiotics, and eventual cholecystectomy.  He is being admitted by the medical service with hopes of improving his hypertension and renal function preoperatively.  He will be made n.p.o. at midnight and if he is improved tomorrow, he may potentially undergo a laparoscopic cholecystectomy.  I again discussed the reasons for removal of the gallbladder with him and his family.  If he acutely worsens, we would have to consider a HIDA scan and percutaneous cholecystostomy but hopefully this would not be necessary.  Coralie Keens MD 01/25/2021, 8:16 PM

## 2021-01-25 NOTE — ED Notes (Signed)
Pt is a transfer from [facility] with gallstones. States doctors are deciding on surgery or not. States has not taken BP meds today except for the IV Labetalol he received there. BP 210/110

## 2021-01-25 NOTE — H&P (Signed)
History and Physical   Adrian Mckenzie PPI:[id number] DOB: [birth year] DOA: 01/25/2021  PCP: Beckie Salts, MD   Patient coming from: Home  Chief Complaint: Abdominal pain  HPI: Adrian Mckenzie is a 55 y.o. male with medical history significant of GERD, hypertension, CKD 4, hyperlipidemia, obesity, OSA, diabetes who presents with abdominal pain.  Patient states that he has had 1 day of abdominal pain starting morning of admission.  He states the pain is located in his epigastric area and it for started when he woke up in the morning.  Has been worsening throughout the day he has had 3 episodes of nausea and nonbilious nonbloody vomiting.  Pain is described as a 10 out of 10 pain in his epigastrium/right upper quadrant.  He states he had a similar pain on February 26 when he was in [place] where he presented to [facility].  He was given fluids antibiotics and pain control and was sent home.  He denies fevers, chills, chest pain, constipation, diarrhea.  ED Course: Vital signs in ED significant for blood pressure in the 063K to 160 systolic.  Lab work-up showed CMP with creatinine stable at 1.91, BUN 23, glucose 158.  Protein 8.2.  CBC within normal limits.  Lactic acid normal, troponin normal, respiratory panel for flu and Covid negative.  Urinalysis significant only for protein and hemoglobin.  Imaging work-up showed chest x-ray with increased heart size but no acute normality's.  Right upper quadrant ultrasound with cholelithiasis in the gallbladder, wall thickening at the upper limit of normal, and positive sonographic Murphy sign.  CT abdomen pelvis showed only cholelithiasis and no acute disease.  Review of Systems: As per HPI otherwise all other systems reviewed and are negative.  Past Medical History:  Diagnosis Date  . Diabetes mellitus without complication ([place])   . Hypertension    History reviewed. No pertinent surgical history.  Social History  reports that he has  never smoked. He has never used smokeless tobacco. He reports current alcohol use. He reports that he does not use drugs.  Allergies  Allergen Reactions  . Atorvastatin Other (See Comments)  . Hydralazine Other (See Comments)    Fatigue.    Family History  Problem Relation Age of Onset  . Hypertension Mother   . CVA Mother   . Hypertension Father   . Deep vein thrombosis Father   Reviewed on admission  Prior to Admission medications   Medication Sig Start Date End Date Taking? Authorizing Provider  aspirin 81 MG EC tablet Take by mouth. 06/24/14  Yes [provider]  atorvastatin (LIPITOR) 40 MG tablet Take 1 tablet by mouth daily. 12/19/16  Yes [provider]  aMILoride (MIDAMOR) 5 MG tablet  11/07/20   [provider]  amLODipine (NORVASC) 10 MG tablet Take 10 mg by mouth daily. 11/07/20   [provider]  glipiZIDE (GLUCOTROL XL) 5 MG 24 hr tablet Take 5 mg by mouth daily. 11/07/20   [provider]  labetalol (NORMODYNE) 300 MG tablet Take 300 mg by mouth 2 (two) times daily. 12/17/20   [provider]  lisinopril (ZESTRIL) 40 MG tablet  11/07/20   [provider]  omeprazole (PRILOSEC) 40 MG capsule Take 40 mg by mouth daily. 11/07/20   [provider]  rosuvastatin (CRESTOR) 10 MG tablet Take by mouth. 11/10/20   [provider]  tadalafil (CIALIS) 20 MG tablet Take by mouth. 11/23/20   [provider]    Physical Exam: Vitals:  01/25/21 1630 01/25/21 1645 01/25/21 1745 01/25/21 1900  BP: (!) 207/128 (!) 201/110 (!) 210/110 (!) 185/103  Pulse: (!) 55 (!) 55 (!) 55 (!) 55  Resp: 18 19 14 15   Temp: 97.6 F (36.4 C)     TempSrc: Oral     SpO2: 98% 99% 99% 98%  Weight:      Height:       Physical Exam Constitutional:      General: He is not in acute distress.    Appearance: Normal appearance.  HENT:     Head: Normocephalic and atraumatic.     Mouth/Throat:     Mouth: Mucous  membranes are moist.     Pharynx: Oropharynx is clear.  Eyes:     Extraocular Movements: Extraocular movements intact.     Pupils: Pupils are equal, round, and reactive to light.  Cardiovascular:     Rate and Rhythm: Normal rate and regular rhythm.     Pulses: Normal pulses.     Heart sounds: Normal heart sounds.  Pulmonary:     Effort: Pulmonary effort is normal. No respiratory distress.     Breath sounds: Normal breath sounds.  Abdominal:     General: Bowel sounds are normal. There is no distension.     Palpations: Abdomen is soft.     Tenderness: There is abdominal tenderness in the right upper quadrant and epigastric area.  Musculoskeletal:        General: No swelling or deformity.  Skin:    General: Skin is warm and dry.  Neurological:     General: No focal deficit present.     Mental Status: Mental status is at baseline.    Labs on Admission: I have personally reviewed following labs and imaging studies  CBC: Recent Labs  Lab 01/25/21 1056  WBC 5.4  HGB 14.5  HCT 43.7  MCV 87.8  PLT 417    Basic Metabolic Panel: Recent Labs  Lab 01/25/21 1056  NA 137  K 3.8  CL 102  CO2 25  GLUCOSE 158*  BUN 23*  CREATININE 1.91*  CALCIUM 9.2    GFR: Estimated Creatinine Clearance: 65.1 mL/min (A) (by C-G formula based on SCr of 1.91 mg/dL (H)).  Liver Function Tests: Recent Labs  Lab 01/25/21 1056  AST 29  ALT 40  ALKPHOS 66  BILITOT 0.6  PROT 8.2*  ALBUMIN 4.5    Urine analysis:    Component Value Date/Time   COLORURINE YELLOW 01/25/2021 1056   APPEARANCEUR CLEAR 01/25/2021 1056   LABSPEC 1.020 01/25/2021 1056   PHURINE 8.0 01/25/2021 1056   GLUCOSEU 100 (A) 01/25/2021 1056   HGBUR SMALL (A) 01/25/2021 1056   BILIRUBINUR NEGATIVE 01/25/2021 [place] 01/25/2021 1056   PROTEINUR 30 (A) 01/25/2021 1056   NITRITE NEGATIVE 01/25/2021 1056   LEUKOCYTESUR NEGATIVE 01/25/2021 1056    Radiological Exams on Admission: CT ABDOMEN PELVIS  W CONTRAST  Result Date: 01/25/2021 CLINICAL DATA:  Epigastric pain/abdominal pain. Concern for pancreatitis. EXAM: CT ABDOMEN AND PELVIS WITH CONTRAST TECHNIQUE: Multidetector CT imaging of the abdomen and pelvis was performed using the standard protocol following bolus administration of intravenous contrast. CONTRAST:  46mL OMNIPAQUE IOHEXOL 300 MG/ML  SOLN COMPARISON:  06/25/2019 FINDINGS: Lower chest: Linear scarring at the left base. Heart is borderline in size. No effusions. Hepatobiliary: Cholelithiasis.  No focal hepatic abnormality. Pancreas: No focal abnormality or ductal dilatation. No surrounding inflammation. Spleen: No focal abnormality.  Normal size. Adrenals/Urinary Tract: No adrenal abnormality.  No focal renal abnormality. No stones or hydronephrosis. Urinary bladder is unremarkable. Stomach/Bowel: Normal appendix. Stomach, large and small bowel grossly unremarkable. Vascular/Lymphatic: Aortic atherosclerosis. No evidence of aneurysm or adenopathy. Reproductive: Lower pelvic structures obscured by beam hardening artifact from bilateral hip replacements. Other: No free fluid or free air. Musculoskeletal: Bilateral hip replacements. No acute bony abnormality. Lucent lesion within the left iliac bone with surrounding sclerosis is again noted and unchanged since prior study. This has a benign appearance. IMPRESSION: Cholelithiasis. No CT evidence of pancreatitis. Aortic atherosclerosis.  Borderline cardiomegaly. No acute findings. Electronically Signed   By: Rolm Baptise M.D.   On: 01/25/2021 14:58   DG Chest Port 1 View  Result Date: 01/25/2021 CLINICAL DATA:  Chest pain EXAM: PORTABLE CHEST 1 VIEW COMPARISON:  06/25/2019 FINDINGS: Cardiac enlargement. Negative for heart failure or edema. No significant pleural effusion. Negative for pneumonia. IMPRESSION: Cardiac enlargement.  No acute abnormality. Electronically Signed   By: Franchot Gallo M.D.   On: 01/25/2021 12:33   US Abdomen Limited RUQ  (LIVER/GB)  Result Date: 01/25/2021 CLINICAL DATA:  Right upper abdominal pain with nausea and vomiting EXAM: ULTRASOUND ABDOMEN LIMITED RIGHT UPPER QUADRANT COMPARISON:  CT abdomen and pelvis June 25, 2019 FINDINGS: Gallbladder: Within the gallbladder, there are echogenic foci which move and shadow consistent with cholelithiasis. Largest gallstone measures 1.6 cm in length. Gallbladder wall thickness is upper normal. No pericholecystic fluid. Patient is focally tender over the gallbladder. Common bile duct: Diameter: 3 mm. No intrahepatic or extrahepatic biliary duct dilatation. Liver: No focal lesion identified. Within normal limits in parenchymal echogenicity. Portal vein is patent on color Doppler imaging with normal direction of blood flow towards the liver. Other: None. IMPRESSION: Cholelithiasis with gallbladder wall thickness upper normal. Patient is focally tender over the gallbladder. These findings raise concern for potential degree of acute cholecystitis. This finding may warrant nuclear medicine hepatobiliary gene study to assess for cystic duct patency. Study otherwise unremarkable. Electronically Signed   By: Lowella Grip III M.D.   On: 01/25/2021 11:53   EKG: Independently reviewed.  Sinus rhythm at 50 bpm.  Assessment/Plan Principal Problem:   Acute calculous cholecystitis Active Problems:   Gastroesophageal reflux disease without esophagitis   Hyperglycemia   Hypertension, accelerated   Acute cholecystitis  Acute cholecystitis Elevated Lipase > Patient with abdominal pain, positive Murphy sign, wall thickening at the upper limit of normal, gallstones and no other explanation for pain. > Suspect cholecystitis and possible mild pancreatitis given epigastric pain in addition to right upper quadrant pain with mild elevation of lipase. > General surgery consulted at [facility] and patient was transferred ED to ED to [facility] and general surgery will be evaluating  patient in the ED. - Appreciate general surgery recommendations - Start ceftriaxone and metronidazole - IV fluids - Pain control as needed - N.p.o. at midnight  Hypertension > Hypertensive urgency in the ED with blood pressure 865H to 846 systolic in the setting of missing his home antihypertensives. - Plan to resume home amiloride, amlodipine, labetalol, lisinopril starting tonight as he missed his doses today once we confirm dosing - As needed hydralazine  CKD 4 > Creatinine stable at 1.91 in ED. - Avoid nephrotoxic agents - Trend renal function and electrolytes  Hyperlipidemia - Continue home statin, states to switch to rosuvastatin but does not know the dose.  Diabetes - SSI  OSA - Continue home CPAP  GERD - Continue home PPI  DVT prophylaxis: SCDs  Code  Status:   Full Family Communication:  Significant other updated at bedside Disposition Plan:   Patient is from:  Home  Anticipated DC to:  Home  Anticipated DC date:  1 to 3 days  Anticipated DC barriers: None  Consults called:  General surgery consulted by EDP and will see the patient.   Admission status:  Observation, telemetry   Severity of Illness: The appropriate patient status for this patient is OBSERVATION. Observation status is judged to be reasonable and necessary in order to provide the required intensity of service to ensure the patient's safety. The patient's presenting symptoms, physical exam findings, and initial radiographic and laboratory data in the context of their medical condition is felt to place them at decreased risk for further clinical deterioration. Furthermore, it is anticipated that the patient will be medically stable for discharge from the hospital within 2 midnights of admission. The following factors support the patient status of observation.   " The patient's presenting symptoms include abdominal pain, nausea, vomiting. " The physical exam findings include abdominal pain. " The  initial radiographic and laboratory data are upper quadrant ultrasound with cholelithiasis, wall thickening at the upper limit of normal in sonographic Murphy sign.  Lipase 58.  Labs otherwise stable.   Marcelyn Bruins MD [facility]  How to contact the [facility] Attending or Consulting provider [place] or covering provider during after hours [place], for this patient?   1. Check the care team in [facility] and look for a) attending/consulting [facility] provider listed and b) the [facility] team listed 2. Log into [email or web address] and use [place]'s universal password to access. If you do not have the password, please contact the hospital operator. 3. Locate the [facility] provider you are looking for under [facility] and page to a number that you can be directly reached. 4. If you still have difficulty reaching the provider, please page the [facility] (Director on Call) for the Hospitalists listed on amion for assistance.  01/25/2021, 8:34 PM

## 2021-01-25 NOTE — ED Triage Notes (Signed)
Pt started having epigastric pain since this morning. Nausea & vomiting x 3. States feels like acid reflux.

## 2021-01-25 NOTE — ED Notes (Signed)
Britney Captain (wife) [phone]

## 2021-01-25 NOTE — ED Provider Notes (Signed)
[facility] EMERGENCY DEPARTMENT Provider Note   CSN: [id number] Arrival date & time: 01/25/21  1026     History Chief Complaint  Patient presents with  . Abdominal Pain    Epigastric    Adrian Mckenzie is a 55 y.o. male.  HPI Patient is 55 year old male with past medical history significant for DM 2, HTN  Patient is presented today with severe 10/10 epigastric abdominal pain that he states is without any aggravating or mitigating factors.  He states that his symptoms began this morning when he woke up several hours ago and seems to have worsened since that time.  He states he has had one episode in his history where he had similar symptoms and went to the emergency room and was told that he had reflux disease.  He states it is epigastric and severe.  Does not seem to radiate anywhere.  Denies any chest pain or shortness of breath no lightheadedness or dizziness denies any black or tarry stools, denies any pleuritic component to the abdominal pain and does not seem to be exertional.  He had not eaten anything today when the epigastric pain began and he states that he vomited 3 times which was nonbloody nonbilious.  Denies any fevers or chills no sick contacts, diarrhea or constipation.  No urinary symptoms such as burning with urination, frequency urgency or hematuria.  No recent surgeries, hospitalization, long travel, hemoptysis, estrogen containing OCP, cancer history.  No unilateral leg swelling.  No history of PE or VTE.  Patient does state that he has a history of gallstones.    Past Medical History:  Diagnosis Date  . Diabetes mellitus without complication ([place])   . Hypertension     Patient Active Problem List   Diagnosis Date Noted  . Acute calculous cholecystitis 01/25/2021  . Panic disorder 11/08/2020  . Impacted cerumen of left ear 06/11/2020  . Onychomycosis of toenail 12/17/2017  . OSA (obstructive sleep apnea) 11/08/2017  . Witnessed apneic spells  09/14/2017  . Snoring 07/30/2017  . Hypokalemia 03/27/2017  . Hypertension, accelerated 09/07/2016  . Hypertensive retinopathy of both eyes 03/28/2016  . Nuclear cataract of both eyes 03/28/2016  . Gastroesophageal reflux disease without esophagitis 03/24/2016  . Type 2 diabetes mellitus with diabetic nephropathy, without long-term current use of insulin ([place]) 12/06/2015  . Hyperglycemia 08/25/2015  . Hyperlipidemia 08/25/2015  . Obesity (BMI 30.0-34.9) 05/07/2015  . CKD (chronic kidney disease) stage 4, GFR 15-29 ml/min ([facility]) 01/18/2015  . 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 Lipoma of forearm 06/24/2014  . Essential hypertension 03/11/2014  . 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 Osteoarthritis of knee 04/15/2012    History reviewed. No pertinent surgical history.     History reviewed. No pertinent family history.  Social History   Tobacco Use  . Smoking status: Never Smoker  Substance Use Topics  . Alcohol use: Yes    Comment: occassional  . Drug use: Never    Home Medications Prior to Admission medications   Medication Sig Start Date End Date Taking? Authorizing Provider  aspirin 81 MG EC tablet Take by mouth. 06/24/14  Yes [provider]  atorvastatin (LIPITOR) 40 MG tablet Take 1 tablet by mouth daily. 12/19/16  Yes [provider]  aMILoride (MIDAMOR) 5 MG tablet  11/07/20   [provider]  amLODipine (NORVASC) 10 MG tablet Take 10 mg by mouth daily. 11/07/20   [provider]  glipiZIDE (GLUCOTROL XL) 5 MG 24  hr tablet Take 5 mg by mouth daily. 11/07/20   [provider]  labetalol (NORMODYNE) 300 MG tablet Take 300 mg by mouth 2 (two) times daily. 12/17/20   [provider]  lisinopril (ZESTRIL) 40 MG tablet  11/07/20   [provider]  omeprazole (PRILOSEC) 40 MG capsule Take 40 mg by mouth daily. 11/07/20   [provider]  rosuvastatin (CRESTOR) 10 MG tablet  Take by mouth. 11/10/20   [provider]  tadalafil (CIALIS) 20 MG tablet Take by mouth. 11/23/20   [provider]    Allergies    Atorvastatin and Hydralazine  Review of Systems   Review of Systems  Constitutional: Negative for chills and fever.  HENT: Negative for congestion.   Eyes: Negative for pain.  Respiratory: Negative for cough and shortness of breath.   Cardiovascular: Negative for chest pain and leg swelling.  Gastrointestinal: Positive for abdominal pain, nausea and vomiting. Negative for diarrhea.  Genitourinary: Negative for dysuria.  Musculoskeletal: Negative for myalgias.  Skin: Negative for rash.  Neurological: Negative for dizziness and headaches.    Physical Exam Updated Vital Signs BP (!) 185/110 (BP Location: Right Arm)   Pulse 61   Temp 97.6 F (36.4 C) (Oral)   Resp 18   Ht 6\' 4"  (1.93 m)   Wt 127 kg   SpO2 96%   BMI 34.08 kg/m   Physical Exam Vitals and nursing note reviewed.  Constitutional:      General: He is in acute distress.  HENT:     Head: Normocephalic and atraumatic.     Nose: Nose normal.  Eyes:     General: No scleral icterus. Cardiovascular:     Rate and Rhythm: Normal rate and regular rhythm.     Pulses: Normal pulses.     Heart sounds: Normal heart sounds.  Pulmonary:     Effort: Pulmonary effort is normal. No respiratory distress.     Breath sounds: No wheezing.  Abdominal:     Palpations: Abdomen is soft.     Tenderness: There is no abdominal tenderness.     Comments: Abdomen is somewhat protuberant and tympanic to percussion however he does have a rigid abd nor does he have guarding.  There is epigastric tenderness to palpation.  No guarding or rebound.  Negative Murphy sign. No CVA tenderness.  No lower abdominal tenderness palpation with light or deep palpation.  Negative heel jar.   Musculoskeletal:     Cervical back: Normal range of motion.     Right lower leg: No edema.     Left lower leg: No  edema.  Skin:    General: Skin is warm and dry.     Capillary Refill: Capillary refill takes less than 2 seconds.  Neurological:     Mental Status: He is alert. Mental status is at baseline.  Psychiatric:        Mood and Affect: Mood normal.        Behavior: Behavior normal.     ED Results / Procedures / Treatments   Labs (all labs ordered are listed, but only abnormal results are displayed) Labs Reviewed  LIPASE, BLOOD - Abnormal; Notable for the following components:      Result Value   Lipase 58 (*)    All other components within normal limits  COMPREHENSIVE METABOLIC PANEL - Abnormal; Notable for the following components:   Glucose, Bld 158 (*)    BUN 23 (*)    Creatinine, Ser 1.91 (*)  Total Protein 8.2 (*)    GFR, Estimated 41 (*)    All other components within normal limits  URINALYSIS, ROUTINE W REFLEX MICROSCOPIC - Abnormal; Notable for the following components:   Glucose, UA 100 (*)    Hgb urine dipstick SMALL (*)    Protein, ur 30 (*)    All other components within normal limits  URINALYSIS, MICROSCOPIC (REFLEX) - Abnormal; Notable for the following components:   Bacteria, UA FEW (*)    All other components within normal limits  CBG MONITORING, ED - Abnormal; Notable for the following components:   Glucose-Capillary 114 (*)    All other components within normal limits  RESP PANEL BY RT-PCR (FLU A&B, COVID) ARPGX2  CBC  LACTIC ACID, PLASMA  TROPONIN I (HIGH SENSITIVITY)  TROPONIN I (HIGH SENSITIVITY)    EKG EKG Interpretation  Date/Time:  Tuesday January 25 2021 10:48:23 EDT Ventricular Rate:  58 PR Interval:    QRS Duration: 103 QT Interval:  471 QTC Calculation: 463 R Axis:   61 Text Interpretation: Sinus rhythm Repol abnrm, possible ischemia No old tracing to compare Confirmed by Sherwood Gambler [phone]) on 01/25/2021 11:00:26 AM Also confirmed by Sherwood Gambler [phone]), editor Lynder Parents [phone])  on 01/25/2021 11:02:56 AM   Radiology CT  ABDOMEN PELVIS W CONTRAST  Result Date: 01/25/2021 CLINICAL DATA:  Epigastric pain/abdominal pain. Concern for pancreatitis. EXAM: CT ABDOMEN AND PELVIS WITH CONTRAST TECHNIQUE: Multidetector CT imaging of the abdomen and pelvis was performed using the standard protocol following bolus administration of intravenous contrast. CONTRAST:  58mL OMNIPAQUE IOHEXOL 300 MG/ML  SOLN COMPARISON:  06/25/2019 FINDINGS: Lower chest: Linear scarring at the left base. Heart is borderline in size. No effusions. Hepatobiliary: Cholelithiasis.  No focal hepatic abnormality. Pancreas: No focal abnormality or ductal dilatation. No surrounding inflammation. Spleen: No focal abnormality.  Normal size. Adrenals/Urinary Tract: No adrenal abnormality. No focal renal abnormality. No stones or hydronephrosis. Urinary bladder is unremarkable. Stomach/Bowel: Normal appendix. Stomach, large and small bowel grossly unremarkable. Vascular/Lymphatic: Aortic atherosclerosis. No evidence of aneurysm or adenopathy. Reproductive: Lower pelvic structures obscured by beam hardening artifact from bilateral hip replacements. Other: No free fluid or free air. Musculoskeletal: Bilateral hip replacements. No acute bony abnormality. Lucent lesion within the left iliac bone with surrounding sclerosis is again noted and unchanged since prior study. This has a benign appearance. IMPRESSION: Cholelithiasis. No CT evidence of pancreatitis. Aortic atherosclerosis.  Borderline cardiomegaly. No acute findings. Electronically Signed   By: Rolm Baptise M.D.   On: 01/25/2021 14:58   DG Chest Port 1 View  Result Date: 01/25/2021 CLINICAL DATA:  Chest pain EXAM: PORTABLE CHEST 1 VIEW COMPARISON:  06/25/2019 FINDINGS: Cardiac enlargement. Negative for heart failure or edema. No significant pleural effusion. Negative for pneumonia. IMPRESSION: Cardiac enlargement.  No acute abnormality. Electronically Signed   By: Franchot Gallo M.D.   On: 01/25/2021 12:33   US  Abdomen Limited RUQ (LIVER/GB)  Result Date: 01/25/2021 CLINICAL DATA:  Right upper abdominal pain with nausea and vomiting EXAM: ULTRASOUND ABDOMEN LIMITED RIGHT UPPER QUADRANT COMPARISON:  CT abdomen and pelvis June 25, 2019 FINDINGS: Gallbladder: Within the gallbladder, there are echogenic foci which move and shadow consistent with cholelithiasis. Largest gallstone measures 1.6 cm in length. Gallbladder wall thickness is upper normal. No pericholecystic fluid. Patient is focally tender over the gallbladder. Common bile duct: Diameter: 3 mm. No intrahepatic or extrahepatic biliary duct dilatation. Liver: No focal lesion identified. Within normal limits in parenchymal echogenicity. Portal vein is patent  on color Doppler imaging with normal direction of blood flow towards the liver. Other: None. IMPRESSION: Cholelithiasis with gallbladder wall thickness upper normal. Patient is focally tender over the gallbladder. These findings raise concern for potential degree of acute cholecystitis. This finding may warrant nuclear medicine hepatobiliary gene study to assess for cystic duct patency. Study otherwise unremarkable. Electronically Signed   By: Lowella Grip III M.D.   On: 01/25/2021 11:53    Procedures Procedures   Medications Ordered in ED Medications  labetalol (NORMODYNE) injection 10 mg (has no administration in time range)  famotidine (PEPCID) IVPB 20 mg premix (0 mg Intravenous Stopped 01/25/21 1143)  HYDROmorphone (DILAUDID) injection 0.5 mg (0.5 mg Intravenous Given 01/25/21 1110)  ondansetron (ZOFRAN) injection 4 mg (4 mg Intravenous Given 01/25/21 1109)  sodium chloride 0.9 % bolus 1,000 mL (0 mLs Intravenous Stopped 01/25/21 1316)  morphine 4 MG/ML injection 4 mg (4 mg Intravenous Given 01/25/21 1354)  alum & mag hydroxide-simeth (MAALOX/MYLANTA) 200-200-20 MG/5ML suspension 30 mL (30 mLs Oral Given 01/25/21 1354)    And  lidocaine (XYLOCAINE) 2 % viscous mouth solution 15 mL (15 mLs  Oral Given 01/25/21 1354)  iohexol (OMNIPAQUE) 300 MG/ML solution 100 mL (80 mLs Intravenous Contrast Given 01/25/21 1431)    ED Course  I have reviewed the triage vital signs and the nursing notes.  Pertinent labs & imaging results that were available during my care of the patient were reviewed by me and considered in my medical decision making (see chart for details).   Clinical Course as of 01/25/21 1648  Tue Jan 25, 2021  1106 Patient initially seen at 05:39 AM. Severe colicky epigastric abdominal pain.  No history peptic ulcer disease and no melena or hematochezia making this less likely.  Seems to be relatively sudden onset no history of arrhythmia I have low suspicion for mesenteric ischemia however will add lactic acid and to work-up.  He has no history of DKA. No history of pulmonary edema and he denies any chest pain or shortness of breath or diaphoresis.  He has had nausea and 3 episodes of vomiting today.  He states he had a similar episode in the past and was told he had acid reflux.  He states that this time his pain seems more severe however.  No history of cardiac disease or heart attack.  EKG is somewhat abnormal with no comparison he does have T wave inversions inferior leads and V6 discussed them I did not physician.  The lateral troponin. Given the patient does have severe epigastric pain and history of gallstones I do have some concern for choledocholithiasis versus gallstone pancreatitis.  Analgesia in the form of Dilaudid provided.  We will also provide Pepcid and Zofran for nausea as well as fluids. [WF]  7673 Patient reassessed just prior to discharge.  EMTALA form was completed.  Dr. Billy Fischer will be ED receiving physician I discussed with earlier.  Dr. Johney Maine will need to be consulted once patient arrives in the ER.  Covid swab is negative. [WF]  4193 Just prior to discharge patient blood pressure was elevated approximately 200/110.  Will provide with 10 of labetalol.  [WF]    Clinical Course User Index [WF] Tedd Sias, [place]    Patient with CMP with elevated creatinine.  Not significantly changed from his baseline actually somewhat improved from from his last creatinine (3.12 on 11/03/20).  No transaminitis or significant electrolyte abnormalities.  CBC without leukocytosis or anemia.  Lactic acid within normal limits  doubt ischemic colitis.  Troponin x2 unremarkable and flat.  Lipase barely elevated lower suspicion for pancreatitis.  Urinalysis with small hemoglobin and some protein few bacteria no indication for antibiotics IV suspicion for urinary tract infection however somewhat concerned for nephrolithiasis although this is not at all correlate to his location and timing of symptoms.  Chest x-ray with stable cardiac enlargement no pulmonary edema and no free air under the diaphragm.  Cholelithiasis with some concern for cholecystitis noted by ultrasonographer. IMPRESSION:  Cholelithiasis with gallbladder wall thickness upper normal. Patient  is focally tender over the gallbladder. These findings raise concern  for potential degree of acute cholecystitis. This finding may  warrant nuclear medicine hepatobiliary gene study to assess for  cystic duct patency.    Study otherwise unremarkable.   Given this patient's abdominal pain is epigastric primarily will obtain CT abdomen pelvis with contrast to evaluate for other intra-abdominal abnormality.  He is required multiple doses of pain medications at this time.  If continues to have severe pain may discuss with surgery.   MDM Rules/Calculators/A&P                          Patient sent by [facility] to [facility] for general surgery evaluation by Dr. Johney Maine.  Final Clinical Impression(s) / ED Diagnoses Final diagnoses:  RUQ abdominal pain  Epigastric abdominal pain    Rx / DC Orders ED Discharge Orders    None       Tedd Sias, [place] 01/25/21 1658    Sherwood Gambler,  MD 01/26/21 [phone]

## 2021-01-26 ENCOUNTER — Observation Stay (HOSPITAL_COMMUNITY): Payer: Medicare Other

## 2021-01-26 ENCOUNTER — Encounter (HOSPITAL_COMMUNITY)
Admission: EM | Disposition: A | Discharge status: Home / Self Care | Payer: Self-pay | Source: Home / Self Care | Attending: Emergency Medicine

## 2021-01-26 ENCOUNTER — Observation Stay (HOSPITAL_COMMUNITY): Payer: Medicare Other | Admitting: Certified Registered Nurse Anesthetist

## 2021-01-26 ENCOUNTER — Encounter (HOSPITAL_COMMUNITY): Payer: Self-pay | Admitting: Internal Medicine

## 2021-01-26 DIAGNOSIS — K219 Gastro-esophageal reflux disease without esophagitis: Secondary | ICD-10-CM | POA: Diagnosis not present

## 2021-01-26 DIAGNOSIS — K8012 Calculus of gallbladder with acute and chronic cholecystitis without obstruction: Secondary | ICD-10-CM | POA: Diagnosis not present

## 2021-01-26 DIAGNOSIS — E669 Obesity, unspecified: Secondary | ICD-10-CM

## 2021-01-26 DIAGNOSIS — E785 Hyperlipidemia, unspecified: Secondary | ICD-10-CM

## 2021-01-26 DIAGNOSIS — G4733 Obstructive sleep apnea (adult) (pediatric): Secondary | ICD-10-CM

## 2021-01-26 DIAGNOSIS — K810 Acute cholecystitis: Secondary | ICD-10-CM

## 2021-01-26 DIAGNOSIS — R739 Hyperglycemia, unspecified: Secondary | ICD-10-CM | POA: Diagnosis not present

## 2021-01-26 DIAGNOSIS — K8000 Calculus of gallbladder with acute cholecystitis without obstruction: Secondary | ICD-10-CM | POA: Diagnosis not present

## 2021-01-26 DIAGNOSIS — Z6834 Body mass index (BMI) 34.0-34.9, adult: Secondary | ICD-10-CM

## 2021-01-26 DIAGNOSIS — K7589 Other specified inflammatory liver diseases: Secondary | ICD-10-CM | POA: Diagnosis not present

## 2021-01-26 DIAGNOSIS — K760 Fatty (change of) liver, not elsewhere classified: Secondary | ICD-10-CM

## 2021-01-26 DIAGNOSIS — I129 Hypertensive chronic kidney disease with stage 1 through stage 4 chronic kidney disease, or unspecified chronic kidney disease: Secondary | ICD-10-CM | POA: Diagnosis not present

## 2021-01-26 DIAGNOSIS — Z20822 Contact with and (suspected) exposure to covid-19: Secondary | ICD-10-CM | POA: Diagnosis not present

## 2021-01-26 HISTORY — PX: DIAGNOSTIC LAPAROSCOPIC LIVER BIOPSY: SHX5797

## 2021-01-26 HISTORY — PX: LAPAROSCOPIC CHOLECYSTECTOMY SINGLE SITE WITH INTRAOPERATIVE CHOLANGIOGRAM: SHX6538

## 2021-01-26 LAB — GLUCOSE, CAPILLARY
Glucose-Capillary: 106 mg/dL — ABNORMAL HIGH (ref 70–99)
Glucose-Capillary: 107 mg/dL — ABNORMAL HIGH (ref 70–99)
Glucose-Capillary: 121 mg/dL — ABNORMAL HIGH (ref 70–99)
Glucose-Capillary: 123 mg/dL — ABNORMAL HIGH (ref 70–99)
Glucose-Capillary: 137 mg/dL — ABNORMAL HIGH (ref 70–99)
Glucose-Capillary: 95 mg/dL (ref 70–99)

## 2021-01-26 LAB — CBC
HCT: 40.9 % (ref 39.0–52.0)
Hemoglobin: 13.5 g/dL (ref 13.0–17.0)
MCH: 29.5 pg (ref 26.0–34.0)
MCHC: 33 g/dL (ref 30.0–36.0)
MCV: 89.3 fL (ref 80.0–100.0)
Platelets: 188 10*3/uL (ref 150–400)
RBC: 4.58 MIL/uL (ref 4.22–5.81)
RDW: 14.8 % (ref 11.5–15.5)
WBC: 7.5 10*3/uL (ref 4.0–10.5)
nRBC: 0 % (ref 0.0–0.2)

## 2021-01-26 LAB — COMPREHENSIVE METABOLIC PANEL
ALT: 33 U/L (ref 0–44)
AST: 24 U/L (ref 15–41)
Albumin: 3.4 g/dL — ABNORMAL LOW (ref 3.5–5.0)
Alkaline Phosphatase: 61 U/L (ref 38–126)
Anion gap: 11 (ref 5–15)
BUN: 19 mg/dL (ref 6–20)
CO2: 24 mmol/L (ref 22–32)
Calcium: 8.8 mg/dL — ABNORMAL LOW (ref 8.9–10.3)
Chloride: 104 mmol/L (ref 98–111)
Creatinine, Ser: 1.94 mg/dL — ABNORMAL HIGH (ref 0.61–1.24)
GFR, Estimated: 41 mL/min — ABNORMAL LOW (ref >60)
Glucose, Bld: 100 mg/dL — ABNORMAL HIGH (ref 70–99)
Potassium: 3.6 mmol/L (ref 3.5–5.1)
Sodium: 139 mmol/L (ref 135–145)
Total Bilirubin: 0.7 mg/dL (ref 0.3–1.2)
Total Protein: 6.7 g/dL (ref 6.5–8.1)

## 2021-01-26 LAB — HEMOGLOBIN A1C
Hgb A1c MFr Bld: 6.3 % — ABNORMAL HIGH (ref 4.8–5.6)
Mean Plasma Glucose: 134.11 mg/dL

## 2021-01-26 LAB — HIV ANTIBODY (ROUTINE TESTING W REFLEX): HIV Screen 4th Generation wRfx: NONREACTIVE

## 2021-01-26 SURGERY — LAPAROSCOPIC CHOLECYSTECTOMY SINGLE SITE WITH INTRAOPERATIVE CHOLANGIOGRAM
Anesthesia: General | Site: Liver

## 2021-01-26 MED ORDER — SODIUM CHLORIDE 0.9 % IV SOLN: 250.0000 mL | INTRAVENOUS | Status: DC | PRN

## 2021-01-26 MED ORDER — DEXTROSE 5 % IV SOLN
3.0000 g | INTRAVENOUS | Status: AC
  Administered 2021-01-26: 3 g via INTRAVENOUS
  Filled 2021-01-26: qty 3

## 2021-01-26 MED ORDER — METRONIDAZOLE IN NACL 5-0.79 MG/ML-% IV SOLN
500.0000 mg | INTRAVENOUS | Status: AC
  Administered 2021-01-26: 500 mg via INTRAVENOUS
  Filled 2021-01-26: qty 100

## 2021-01-26 MED ORDER — MIDAZOLAM HCL 2 MG/2ML IJ SOLN
INTRAMUSCULAR | Status: DC | PRN
  Administered 2021-01-26: 2 mg via INTRAVENOUS

## 2021-01-26 MED ORDER — ONDANSETRON HCL 4 MG/2ML IJ SOLN: 4.0000 mg | Freq: Once | INTRAMUSCULAR | Status: DC | PRN

## 2021-01-26 MED ORDER — ACETAMINOPHEN 160 MG/5ML PO SOLN: 325.0000 mg | ORAL | Status: DC | PRN

## 2021-01-26 MED ORDER — ALPRAZOLAM 0.5 MG PO TABS: 0.5000 mg | ORAL_TABLET | Freq: Every day | ORAL | Status: DC | PRN

## 2021-01-26 MED ORDER — CLEVIDIPINE BUTYRATE 0.5 MG/ML IV EMUL
0.0000 mg/h | INTRAVENOUS | Status: DC
  Administered 2021-01-26: 1 mg/h via INTRAVENOUS
  Filled 2021-01-26: qty 50

## 2021-01-26 MED ORDER — DEXAMETHASONE SODIUM PHOSPHATE 10 MG/ML IJ SOLN
INTRAMUSCULAR | Status: AC
  Filled 2021-01-26: qty 1

## 2021-01-26 MED ORDER — HYDRALAZINE HCL 20 MG/ML IJ SOLN: 10.0000 mg | INTRAMUSCULAR | Status: DC | PRN

## 2021-01-26 MED ORDER — GABAPENTIN 300 MG PO CAPS: 300.0000 mg | ORAL_CAPSULE | ORAL | Status: AC

## 2021-01-26 MED ORDER — METHOCARBAMOL 1000 MG/10ML IJ SOLN
1000.0000 mg | Freq: Four times a day (QID) | INTRAVENOUS | Status: DC | PRN
  Filled 2021-01-26: qty 10

## 2021-01-26 MED ORDER — BUPIVACAINE-EPINEPHRINE (PF) 0.25% -1:200000 IJ SOLN
INTRAMUSCULAR | Status: AC
  Filled 2021-01-26: qty 30

## 2021-01-26 MED ORDER — BUPIVACAINE LIPOSOME 1.3 % IJ SUSP
INTRAMUSCULAR | Status: DC | PRN
  Administered 2021-01-26: 20 mL

## 2021-01-26 MED ORDER — TRAMADOL HCL 50 MG PO TABS
50.0000 mg | ORAL_TABLET | Freq: Four times a day (QID) | ORAL | Status: DC | PRN
  Administered 2021-01-26: 100 mg via ORAL
  Filled 2021-01-26: qty 2

## 2021-01-26 MED ORDER — PROPOFOL 10 MG/ML IV BOLUS
INTRAVENOUS | Status: AC
  Filled 2021-01-26: qty 20

## 2021-01-26 MED ORDER — MEPERIDINE HCL 50 MG/ML IJ SOLN: 6.2500 mg | INTRAMUSCULAR | Status: DC | PRN

## 2021-01-26 MED ORDER — BUPIVACAINE-EPINEPHRINE 0.25% -1:200000 IJ SOLN
INTRAMUSCULAR | Status: DC | PRN
  Administered 2021-01-26: 60 mL

## 2021-01-26 MED ORDER — ROCURONIUM BROMIDE 10 MG/ML (PF) SYRINGE
PREFILLED_SYRINGE | INTRAVENOUS | Status: DC | PRN
  Administered 2021-01-26: 80 mg via INTRAVENOUS

## 2021-01-26 MED ORDER — FENTANYL CITRATE (PF) 250 MCG/5ML IJ SOLN
INTRAMUSCULAR | Status: AC
  Filled 2021-01-26: qty 5

## 2021-01-26 MED ORDER — OXYCODONE HCL 5 MG/5ML PO SOLN: 5.0000 mg | Freq: Once | ORAL | Status: DC | PRN

## 2021-01-26 MED ORDER — FENTANYL CITRATE (PF) 250 MCG/5ML IJ SOLN
INTRAMUSCULAR | Status: DC | PRN
  Administered 2021-01-26: 100 ug via INTRAVENOUS
  Administered 2021-01-26 (×2): 50 ug via INTRAVENOUS

## 2021-01-26 MED ORDER — SUGAMMADEX SODIUM 200 MG/2ML IV SOLN
INTRAVENOUS | Status: DC | PRN
  Administered 2021-01-26: 300 mg via INTRAVENOUS

## 2021-01-26 MED ORDER — CALCIUM POLYCARBOPHIL 625 MG PO TABS
625.0000 mg | ORAL_TABLET | Freq: Two times a day (BID) | ORAL | Status: DC
  Administered 2021-01-26 – 2021-01-27 (×2): 625 mg via ORAL
  Filled 2021-01-26 (×2): qty 1

## 2021-01-26 MED ORDER — ACETAMINOPHEN 325 MG PO TABS: 325.0000 mg | ORAL_TABLET | ORAL | Status: DC | PRN

## 2021-01-26 MED ORDER — ONDANSETRON HCL 4 MG/2ML IJ SOLN
INTRAMUSCULAR | Status: DC | PRN
  Administered 2021-01-26: 4 mg via INTRAVENOUS

## 2021-01-26 MED ORDER — ROCURONIUM BROMIDE 10 MG/ML (PF) SYRINGE
PREFILLED_SYRINGE | INTRAVENOUS | Status: AC
  Filled 2021-01-26: qty 10

## 2021-01-26 MED ORDER — CHLORHEXIDINE GLUCONATE CLOTH 2 % EX PADS: 6.0000 | MEDICATED_PAD | Freq: Once | CUTANEOUS | Status: DC

## 2021-01-26 MED ORDER — LACTATED RINGERS IR SOLN
Status: DC | PRN
  Administered 2021-01-26: 1000 mL

## 2021-01-26 MED ORDER — LIDOCAINE 2% (20 MG/ML) 5 ML SYRINGE
INTRAMUSCULAR | Status: DC | PRN
  Administered 2021-01-26: 40 mg via INTRAVENOUS

## 2021-01-26 MED ORDER — SODIUM CHLORIDE 0.9% FLUSH: 3.0000 mL | INTRAVENOUS | Status: DC | PRN

## 2021-01-26 MED ORDER — MAGIC MOUTHWASH
15.0000 mL | Freq: Four times a day (QID) | ORAL | Status: DC | PRN
  Filled 2021-01-26: qty 15

## 2021-01-26 MED ORDER — ACETAMINOPHEN 500 MG PO TABS: 1000.0000 mg | ORAL_TABLET | ORAL | Status: AC

## 2021-01-26 MED ORDER — LIP MEDEX EX OINT
1.0000 "application " | TOPICAL_OINTMENT | Freq: Two times a day (BID) | CUTANEOUS | Status: DC
  Administered 2021-01-26 – 2021-01-27 (×2): 1 via TOPICAL
  Filled 2021-01-26: qty 7

## 2021-01-26 MED ORDER — STERILE WATER FOR IRRIGATION IR SOLN
Status: DC | PRN
  Administered 2021-01-26: 1000 mL

## 2021-01-26 MED ORDER — SODIUM CHLORIDE 0.9% FLUSH
3.0000 mL | Freq: Two times a day (BID) | INTRAVENOUS | Status: DC
  Administered 2021-01-26 – 2021-01-27 (×2): 3 mL via INTRAVENOUS

## 2021-01-26 MED ORDER — LACTATED RINGERS IV BOLUS: 1000.0000 mL | Freq: Three times a day (TID) | INTRAVENOUS | Status: DC | PRN

## 2021-01-26 MED ORDER — LIDOCAINE 2% (20 MG/ML) 5 ML SYRINGE
INTRAMUSCULAR | Status: AC
  Filled 2021-01-26: qty 5

## 2021-01-26 MED ORDER — INSULIN ASPART 100 UNIT/ML ~~LOC~~ SOLN: 0.0000 [IU] | Freq: Every day | SUBCUTANEOUS | Status: DC

## 2021-01-26 MED ORDER — DEXAMETHASONE SODIUM PHOSPHATE 10 MG/ML IJ SOLN
INTRAMUSCULAR | Status: DC | PRN
  Administered 2021-01-26: 4 mg via INTRAVENOUS

## 2021-01-26 MED ORDER — FENTANYL CITRATE (PF) 100 MCG/2ML IJ SOLN: 25.0000 ug | INTRAMUSCULAR | Status: DC | PRN

## 2021-01-26 MED ORDER — ACETAMINOPHEN 500 MG PO TABS
ORAL_TABLET | ORAL | Status: AC
  Administered 2021-01-26: 1000 mg via ORAL
  Filled 2021-01-26: qty 2

## 2021-01-26 MED ORDER — GABAPENTIN 300 MG PO CAPS
ORAL_CAPSULE | ORAL | Status: AC
  Administered 2021-01-26: 300 mg via ORAL
  Filled 2021-01-26: qty 1

## 2021-01-26 MED ORDER — SUGAMMADEX SODIUM 500 MG/5ML IV SOLN
INTRAVENOUS | Status: AC
  Filled 2021-01-26: qty 5

## 2021-01-26 MED ORDER — IOHEXOL 300 MG/ML  SOLN
INTRAMUSCULAR | Status: DC | PRN
  Administered 2021-01-26: 8.5 mL

## 2021-01-26 MED ORDER — PROCHLORPERAZINE EDISYLATE 10 MG/2ML IJ SOLN: 5.0000 mg | INTRAMUSCULAR | Status: DC | PRN

## 2021-01-26 MED ORDER — ONDANSETRON HCL 4 MG/2ML IJ SOLN
INTRAMUSCULAR | Status: AC
  Filled 2021-01-26: qty 2

## 2021-01-26 MED ORDER — LACTATED RINGERS IV SOLN: INTRAVENOUS | Status: DC

## 2021-01-26 MED ORDER — ACETAMINOPHEN 500 MG PO TABS
1000.0000 mg | ORAL_TABLET | Freq: Four times a day (QID) | ORAL | Status: DC
  Administered 2021-01-26 – 2021-01-27 (×3): 1000 mg via ORAL
  Filled 2021-01-26 (×3): qty 2

## 2021-01-26 MED ORDER — BUPIVACAINE LIPOSOME 1.3 % IJ SUSP
20.0000 mL | Freq: Once | INTRAMUSCULAR | Status: DC
  Filled 2021-01-26: qty 20

## 2021-01-26 MED ORDER — DIPHENHYDRAMINE HCL 50 MG/ML IJ SOLN: 12.5000 mg | Freq: Four times a day (QID) | INTRAMUSCULAR | Status: DC | PRN

## 2021-01-26 MED ORDER — 0.9 % SODIUM CHLORIDE (POUR BTL) OPTIME
TOPICAL | Status: DC | PRN
  Administered 2021-01-26: 1000 mL

## 2021-01-26 MED ORDER — METHOCARBAMOL 500 MG PO TABS: 1000.0000 mg | ORAL_TABLET | Freq: Four times a day (QID) | ORAL | Status: DC | PRN

## 2021-01-26 MED ORDER — INSULIN ASPART 100 UNIT/ML ~~LOC~~ SOLN: 0.0000 [IU] | Freq: Three times a day (TID) | SUBCUTANEOUS | Status: DC

## 2021-01-26 MED ORDER — ENSURE PRE-SURGERY PO LIQD
296.0000 mL | Freq: Once | ORAL | Status: DC
  Filled 2021-01-26: qty 296

## 2021-01-26 MED ORDER — MIDAZOLAM HCL 2 MG/2ML IJ SOLN
INTRAMUSCULAR | Status: AC
  Filled 2021-01-26: qty 2

## 2021-01-26 MED ORDER — PROPOFOL 10 MG/ML IV BOLUS
INTRAVENOUS | Status: DC | PRN
  Administered 2021-01-26: 200 mg via INTRAVENOUS

## 2021-01-26 MED ORDER — OXYCODONE HCL 5 MG PO TABS
5.0000 mg | ORAL_TABLET | Freq: Once | ORAL | Status: DC | PRN
Start: 2021-01-26 — End: 2021-01-26

## 2021-01-26 SURGICAL SUPPLY — 45 items
APPLIER CLIP 5 13 M/L LIGAMAX5 (MISCELLANEOUS) ×3
CABLE HIGH FREQUENCY MONO STRZ (ELECTRODE) ×3 IMPLANT
CHLORAPREP W/TINT 26 (MISCELLANEOUS) ×3 IMPLANT
CLIP APPLIE 5 13 M/L LIGAMAX5 (MISCELLANEOUS) ×2 IMPLANT
COVER MAYO STAND STRL (DRAPES) ×3 IMPLANT
COVER SURGICAL LIGHT HANDLE (MISCELLANEOUS) ×3 IMPLANT
COVER WAND RF STERILE (DRAPES) IMPLANT
DECANTER SPIKE VIAL GLASS SM (MISCELLANEOUS) ×3 IMPLANT
DRAIN CHANNEL 19F RND (DRAIN) IMPLANT
DRAPE C-ARM 42X120 X-RAY (DRAPES) ×3 IMPLANT
DRAPE WARM FLUID 44X44 (DRAPES) ×3 IMPLANT
DRSG TEGADERM 4X4.75 (GAUZE/BANDAGES/DRESSINGS) ×3 IMPLANT
ELECT REM PT RETURN 15FT ADLT (MISCELLANEOUS) ×3 IMPLANT
ENDOLOOP SUT PDS II  0 18 (SUTURE)
ENDOLOOP SUT PDS II 0 18 (SUTURE) IMPLANT
EVACUATOR SILICONE 100CC (DRAIN) IMPLANT
GAUZE SPONGE 2X2 8PLY STRL LF (GAUZE/BANDAGES/DRESSINGS) ×2 IMPLANT
GAUZE SPONGE 4X4 12PLY STRL (GAUZE/BANDAGES/DRESSINGS) ×3 IMPLANT
GLOVE SURG LTX SZ8 (GLOVE) ×3 IMPLANT
GLOVE SURG UNDER LTX SZ8 (GLOVE) ×3 IMPLANT
GOWN STRL REUS W/TWL XL LVL3 (GOWN DISPOSABLE) ×6 IMPLANT
IRRIG SUCT STRYKERFLOW 2 WTIP (MISCELLANEOUS) ×3
IRRIGATION SUCT STRKRFLW 2 WTP (MISCELLANEOUS) ×2 IMPLANT
KIT BASIN OR (CUSTOM PROCEDURE TRAY) ×3 IMPLANT
KIT TURNOVER KIT A (KITS) ×3 IMPLANT
NEEDLE BIOPSY 14X6 SOFT TISS (NEEDLE) ×3 IMPLANT
PAD POSITIONING PINK XL (MISCELLANEOUS) ×3 IMPLANT
PAD TELFA 2X3 NADH STRL (GAUZE/BANDAGES/DRESSINGS) ×3 IMPLANT
PENCIL SMOKE EVACUATOR (MISCELLANEOUS) IMPLANT
POUCH RETRIEVAL ECOSAC 10 (ENDOMECHANICALS) IMPLANT
POUCH RETRIEVAL ECOSAC 10MM (ENDOMECHANICALS)
PROTECTOR NERVE ULNAR (MISCELLANEOUS) IMPLANT
SCISSORS LAP 5X35 DISP (ENDOMECHANICALS) ×3 IMPLANT
SET CHOLANGIOGRAPH MIX (MISCELLANEOUS) ×3 IMPLANT
SET TUBE SMOKE EVAC HIGH FLOW (TUBING) ×3 IMPLANT
SHEARS HARMONIC ACE PLUS 36CM (ENDOMECHANICALS) ×3 IMPLANT
SPONGE GAUZE 2X2 STER 10/PKG (GAUZE/BANDAGES/DRESSINGS) ×1
SUT MNCRL AB 4-0 PS2 18 (SUTURE) ×3 IMPLANT
SUT PDS AB 1 CT1 27 (SUTURE) ×6 IMPLANT
SYR 20ML LL LF (SYRINGE) ×3 IMPLANT
TOWEL OR 17X26 10 PK STRL BLUE (TOWEL DISPOSABLE) ×3 IMPLANT
TOWEL OR NON WOVEN STRL DISP B (DISPOSABLE) ×3 IMPLANT
TRAY LAPAROSCOPIC (CUSTOM PROCEDURE TRAY) ×3 IMPLANT
TROCAR BLADELESS OPT 5 100 (ENDOMECHANICALS) ×3 IMPLANT
TROCAR BLADELESS OPT 5 150 (ENDOMECHANICALS) ×3 IMPLANT

## 2021-01-26 NOTE — Plan of Care (Signed)
Problem: Education: Goal: Knowledge of General Education information will improve Description: Including pain rating scale, medication(s)/side effects and non-pharmacologic comfort measures 01/26/2021 2230 by Micheline Chapman, RN Outcome: Progressing 01/26/2021 2228 by Micheline Chapman, RN Outcome: Progressing   Problem: Health Behavior/Discharge Planning: Goal: Ability to manage health-related needs will improve 01/26/2021 2230 by Micheline Chapman, RN Outcome: Progressing 01/26/2021 2228 by Micheline Chapman, RN Outcome: Progressing   Problem: Clinical Measurements: Goal: Will remain free from infection 01/26/2021 2230 by Micheline Chapman, RN Outcome: Progressing 01/26/2021 2228 by Micheline Chapman, RN Outcome: Progressing Goal: Diagnostic test results will improve 01/26/2021 2230 by Micheline Chapman, RN Outcome: Progressing 01/26/2021 2228 by Micheline Chapman, RN Outcome: Progressing Goal: Respiratory complications will improve 01/26/2021 2230 by Micheline Chapman, RN Outcome: Progressing 01/26/2021 2228 by Micheline Chapman, RN Outcome: Progressing Goal: Cardiovascular complication will be avoided 01/26/2021 2230 by Micheline Chapman, RN Outcome: Progressing 01/26/2021 2228 by Micheline Chapman, RN Outcome: Progressing   Problem: Activity: Goal: Risk for activity intolerance will decrease 01/26/2021 2230 by Micheline Chapman, RN Outcome: Progressing 01/26/2021 2228 by Micheline Chapman, RN Outcome: Progressing   Problem: Nutrition: Goal: Adequate nutrition will be maintained 01/26/2021 2230 by Micheline Chapman, RN Outcome: Progressing 01/26/2021 2228 by Micheline Chapman, RN Outcome: Progressing   Problem: Coping: Goal: Level of anxiety will decrease 01/26/2021 2230 by Micheline Chapman, RN Outcome: Progressing 01/26/2021 2228 by Micheline Chapman, RN Outcome: Progressing   Problem: Elimination: Goal: Will not experience complications related to bowel motility 01/26/2021 2230 by Micheline Chapman,  RN Outcome: Progressing 01/26/2021 2228 by Micheline Chapman, RN Outcome: Progressing Goal: Will not experience complications related to urinary retention 01/26/2021 2230 by Micheline Chapman, RN Outcome: Progressing 01/26/2021 2228 by Micheline Chapman, RN Outcome: Progressing   Problem: Pain Managment: Goal: General experience of comfort will improve 01/26/2021 2230 by Micheline Chapman, RN Outcome: Progressing 01/26/2021 2228 by Micheline Chapman, RN Outcome: Progressing   Problem: Safety: Goal: Ability to remain free from injury will improve 01/26/2021 2230 by Micheline Chapman, RN Outcome: Progressing 01/26/2021 2228 by Micheline Chapman, RN Outcome: Progressing   Problem: Skin Integrity: Goal: Risk for impaired skin integrity will decrease 01/26/2021 2230 by Micheline Chapman, RN Outcome: Progressing 01/26/2021 2228 by Micheline Chapman, RN Outcome: Progressing   Problem: Education: Goal: Ability to describe self-care measures that may prevent or decrease complications (Diabetes Survival Skills Education) will improve 01/26/2021 2230 by Micheline Chapman, RN Outcome: Progressing 01/26/2021 2228 by Micheline Chapman, RN Outcome: Progressing Goal: Individualized Educational Video(s) 01/26/2021 2230 by Micheline Chapman, RN Outcome: Progressing 01/26/2021 2228 by Micheline Chapman, RN Outcome: Progressing   Problem: Coping: Goal: Ability to adjust to condition or change in health will improve 01/26/2021 2230 by Micheline Chapman, RN Outcome: Progressing 01/26/2021 2228 by Micheline Chapman, RN Outcome: Progressing   Problem: Fluid Volume: Goal: Ability to maintain a balanced intake and output will improve 01/26/2021 2230 by Micheline Chapman, RN Outcome: Progressing 01/26/2021 2228 by Micheline Chapman, RN Outcome: Progressing   Problem: Health Behavior/Discharge Planning: Goal: Ability to identify and utilize available resources and services will improve 01/26/2021 2230 by Micheline Chapman, RN Outcome:  Progressing 01/26/2021 2228 by Micheline Chapman, RN Outcome: Progressing Goal: Ability to manage health-related needs will improve 01/26/2021 2230 by Micheline Chapman, RN Outcome: Progressing 01/26/2021 2228 by Micheline Chapman, RN Outcome: Progressing  Problem: Metabolic: Goal: Ability to maintain appropriate glucose levels will improve 01/26/2021 2230 by Micheline Chapman, RN Outcome: Progressing 01/26/2021 2228 by Micheline Chapman, RN Outcome: Progressing   Problem: Nutritional: Goal: Maintenance of adequate nutrition will improve 01/26/2021 2230 by Micheline Chapman, RN Outcome: Progressing 01/26/2021 2228 by Micheline Chapman, RN Outcome: Progressing Goal: Progress toward achieving an optimal weight will improve 01/26/2021 2230 by Micheline Chapman, RN Outcome: Progressing 01/26/2021 2228 by Micheline Chapman, RN Outcome: Progressing   Problem: Skin Integrity: Goal: Risk for impaired skin integrity will decrease 01/26/2021 2230 by Micheline Chapman, RN Outcome: Progressing 01/26/2021 2228 by Micheline Chapman, RN Outcome: Progressing   Problem: Tissue Perfusion: Goal: Adequacy of tissue perfusion will improve 01/26/2021 2230 by Micheline Chapman, RN Outcome: Progressing 01/26/2021 2228 by Micheline Chapman, RN Outcome: Progressing   Problem: Education: Goal: Required Educational Video(s) Outcome: Progressing   Problem: Clinical Measurements: Goal: Postoperative complications will be avoided or minimized Outcome: Progressing   Problem: Skin Integrity: Goal: Demonstration of wound healing without infection will improve Outcome: Progressing

## 2021-01-26 NOTE — Op Note (Signed)
01/26/2021  PATIENT:  Adrian Mckenzie  55 y.o. male  Patient Care Team: Beckie Salts, MD as PCP - General (Internal Medicine) Elijio Miles, MD as Referring Physician (Physician Assistant)  PRE-OPERATIVE DIAGNOSIS:    Acute on Chronic Calculus Cholecystitis  POST-OPERATIVE DIAGNOSIS:   Acute on Chronic Calculus Cholecystitis  Liver: Fatty steatohepatitis  PROCEDURE:  Core Liver Biopsy (CPT code 47001) & SINGLE SITE Laparoscopic cholecystectomy with intraoperative cholangiogram (CPT code [phone])  SURGEON:  Adin Hector, MD, FACS.  ASSISTANT: OR Staff   ANESTHESIA:    General with endotracheal intubation Local anesthetic as a field block  EBL:  (See Anesthesia Intraoperative Record) Total I/O In: -  Out: 20 [Blood:20]  Delay start of Pharmacological VTE agent (>24hrs) due to surgical blood loss or risk of bleeding:  no  DRAINS: None   SPECIMEN: Gallbladder & Core liver biopsies    DISPOSITION OF SPECIMEN:  PATHOLOGY  COUNTS:  YES  PLAN OF CARE: Admit for overnight observation  PATIENT DISPOSITION:  PACU - hemodynamically stable.  INDICATION: Met with multiple medical problems including significant hypertension with biliary colic that would not resolve on persistent pain.  Work-up concerning for cholecystitis.  Some improvement with pain meds and antibiotics but recommendation made for cholecystectomy.  Very hypertensive.  Medical admission with some stabilization.  Cleared for surgery.  The anatomy & physiology of hepatobiliary & pancreatic function was discussed.  The pathophysiology of gallbladder dysfunction was discussed.  Natural history risks without surgery was discussed.   I feel the risks of no intervention will lead to serious problems that outweigh the operative risks; therefore, I recommended cholecystectomy to remove the pathology.  I explained laparoscopic techniques with possible need for an open approach.  Probable cholangiogram to evaluate the  bilary tract was explained as well.    Risks such as bleeding, infection, abscess, leak, injury to other organs, need for further treatment, heart attack, death, and other risks were discussed.  I noted a good likelihood this will help address the problem.  Possibility that this will not correct all abdominal symptoms was explained.  Goals of post-operative recovery were discussed as well.  We will work to minimize complications.  An educational handout further explaining the pathology and treatment options was given as well.  Questions were answered.  The patient expresses understanding & wishes to proceed with surgery.  OR FINDINGS: Chronic wall changes and thickening suspicious for chronic cholecystitis in the setting of acute edema and inflammation consistent with acute cholecystitis as well.  Cholangiogram showing narrowed biliary system but no stricture or leak or other concern.  Liver: Moderate fatty change with some thickening suspicious for steatohepatitis.  Core biopsies done  DESCRIPTION:   The patient was identified & brought in the operating room. The patient was positioned supine with arms tucked. SCDs were active during the entire case. The patient underwent general anesthesia without any difficulty.  The abdomen was prepped and draped in a sterile fashion. A Surgical Timeout confirmed our plan.  I made a transverse curvilinear incision through the superior umbilical fold.  I placed a 55m long port through the supraumbilical fascia using a modified Hassan cutdown technique with umbilical stalk fascial countertraction. I began carbon dioxide insufflation.  No change in end tidal CO2 measurement.   Camera inspection revealed no injury. There were no adhesions to the anterior abdominal wall supraumbilically.  I proceeded to continue with single site technique. I placed a #5 port in left upper aspect of the wound. I placed  a 5 mm atraumatic grasper in the right inferior aspect of the  wound.  I turned attention to the right upper quadrant.  Gallbladder was somewhat thickened inflamed.  The gallbladder fundus was elevated cephalad. I freed adhesions to the ventral surface of the gallbladder off carefully.  I freed the peritoneal coverings between the gallbladder and the liver on the posteriolateral and anteriomedial walls. I alternated between Harmonic & blunt [place] dissection to help get a good critical view of the cystic artery and cystic duct.  did further dissection to free 50%of the gallbladder off the liver bed to get a good critical view of the infundibulum and cystic duct. I dissected out the cystic artery; and, after getting a good 360 view, ligated the anterior & posterior branches of the cystic artery close on the infundibulum using the Harmonic ultrasonic dissection.  I did place a clip on the posterior cystic arterial branch since there was some thickening there.  I skeletonized the cystic duct.  I placed a clip on the infundibulum. I did a partial cystic duct-otomy and ensured patency. I placed a 5 [place] cholangiocatheter through a puncture site at the right subcostal ridge of the abdominal wall and directed it into the cystic duct.  We ran a cholangiogram with dilute radio-opaque contrast and continuous fluoroscopy. Contrast flowed from a side branch consistent with cystic duct cannulization. Contrast flowed up the common hepatic duct into the right and left intrahepatic chains out to secondary radicals. Contrast flowed down the common bile duct easily across the normal ampulla into the duodenum.  This was consistent with a normal cholangiogram.  I removed the cholangiocatheter. I placed clips on the cystic duct x4.  I completed cystic duct transection. I freed the gallbladder from its remaining attachments to the liver.  I placed it inside and ecosac.  I did irrigation.    Because the liver was somewhat thickened with fatty change on it in the setting hypertension, I  decided to do core liver biopsies.  Used a 14-gauge Tru-Cut needle through the same right subcostal puncture site I used for the cholangiogram.  I passed 3 times in the anterior right hepatic lobe and got good cores.  Assured hemostasis.  Did irrigation and inspection.  Hemostasis was good.  I inspected the rest of the abdomen & detected no injury nor bleeding elsewhere.  I removed the gallbladder out the supraumbilical fascia inside an EcoSac.  Small and medium size spherical gallstones felt.  I closed the fascia transversely using #1 PDS interrupted stitches. I closed the skin using 4-0 monocryl stitch.  Sterile dressing was applied. The patient was extubated & arrived in the PACU in stable condition..  I had discussed postoperative care with the patient in the holding area.  Initially I could not get a hold of his wife but she called back. . I discussed operative findings, updated the patient's status, discussed probable steps to recovery, and gave postoperative recommendations to the patient's spouse, Tawana.  Recommendations were made.  Questions were answered.  She expressed understanding & appreciation.  Adin Hector, M.D., F.A.C.S. Gastrointestinal and Minimally Invasive Surgery [facility] [address] [place], [place] [address] [phone] Main / Paging  01/26/2021 12:44 PM

## 2021-01-26 NOTE — Progress Notes (Signed)
Pt declined nocturnal cpap.  Pt was advised that RT is available all night should he change his mind.  RN aware.

## 2021-01-26 NOTE — Progress Notes (Signed)
PROGRESS NOTE  Adrian Mckenzie YFV:[id number] DOB: 1966/04/27   PCP: Beckie Salts, MD  Patient is from: Home  DOA: 01/25/2021 LOS: 0  Chief complaints: Abdominal pain  Brief Narrative / Interim history: 55 year old M with PMH of CKD-4, HTN, HLD, OSA, DM-2, obesity and GERD presenting with acute RUQ and epigastric abdominal pain, nausea and vomiting, and admitted for acute calculus cholecystitis and pancreatitis, and uncontrolled hypertension.  Patient underwent laparoscopic cholecystectomy with intraoperative cholangiogram, and core liver biopsy on 01/26/2021.  Subjective: Seen and examined this afternoon after he returned from surgery.  No complaints.  He denies headache, vision change, chest pain, dyspnea, nausea or vomiting.  Abdominal pain mild.   Objective: Vitals:   01/26/21 1330 01/26/21 1345 01/26/21 1400 01/26/21 1414  BP: (!) 184/108 (!) 179/109 (!) 183/107 (!) 198/109  Pulse: (!) 52 61 (!) 56 (!) 55  Resp: 10 12 17    Temp:   98.1 F (36.7 C)   TempSrc:      SpO2: 97% 95% 98% 99%  Weight:      Height:        Intake/Output Summary (Last 24 hours) at 01/26/2021 1803 Last data filed at 01/26/2021 1400 Gross per 24 hour  Intake 2786.63 ml  Output 420 ml  Net 2366.63 ml   Filed Weights   01/25/21 1038  Weight: 127 kg    Examination:  GENERAL: No apparent distress.  Nontoxic. HEENT: MMM.  Vision and hearing grossly intact.  NECK: Supple.  No apparent JVD.  RESP:  No IWOB.  Fair aeration bilaterally. CVS:  RRR. Heart sounds normal.  ABD/GI/GU: BS+. Abd soft.  Slightly tender MSK/EXT:  Moves extremities. No apparent deformity. No edema.  SKIN: no apparent skin lesion or wound NEURO: Awake, alert and oriented appropriately.  No apparent focal neuro deficit. PSYCH: Calm. Normal affect.  Procedures:  01/26/2021- laparoscopic cholecystectomy with intraoperative cholangiogram, and core liver biopsy  Microbiology summarized: COVID-19 and influenza PCR  nonreactive.  Assessment & Plan: Acute calculus cholecystitis Acute calculus pancreatitis?  LFT and total bili within normal. -Status post laparoscopic cholecystectomy -Appreciate general surgery recommendations -Continue ceftriaxone and metronidazole -Discontinue IV fluid -Pain control per surgery -Now on clear liquid diet  Hepatic steatosis -S/p core liver biopsy by general surgery  Uncontrolled hypertension: BP remains elevated.  He did not receive most of his BP meds until this p.m. -Continue amlodipine, labetalol, lisinopril and amiloride -As needed hydralazine -Stop IV fluid  NIDDM-2 with hyperlipidemia, hyperglycemia and CKD-4 Recent Labs  Lab 01/26/21 0041 01/26/21 0454 01/26/21 0956 01/26/21 1328 01/26/21 1658  GLUCAP 107* 95 106* 123* 137*  -Check hemoglobin A1c -Change CBG and SSI to ACHS -Continue home statin   CKD-4: Stable Recent Labs    01/25/21 1056 01/26/21 0422  BUN 23* 19  CREATININE 1.91* 1.94*  -Continue monitoring -Avoid nephrotoxic meds  OSA: Uses CPAP at home.  -Encouraged CPAP here.  GERD -Continue home PPI  Class I obesity Body mass index is 34.08 kg/m.         DVT prophylaxis:  SCD's Start: 01/26/21 0740 SCDs Start: 01/25/21 1955  Code Status: Full code Family Communication: Patient and/or RN. Available if any question.  Level of care: Telemetry Status is: Observation  The patient will require care spanning > 2 midnights and should be moved to inpatient because: Hemodynamically unstable, Ongoing active pain requiring inpatient pain management, IV treatments appropriate due to intensity of illness or inability to take PO and Inpatient level of care appropriate due to severity  of illness  Dispo: The patient is from: Home              Anticipated d/c is to: Home              Patient currently is not medically stable to d/c.   Difficult to place patient No       Consultants:  General surgery   Sch Meds:   Scheduled Meds:  acetaminophen  1,000 mg Oral Q6H   aMILoride  5 mg Oral Daily   amLODipine  10 mg Oral Daily   bupivacaine liposome  20 mL Infiltration Once   Chlorhexidine Gluconate Cloth  6 each Topical Once   feeding supplement  296 mL Oral Once   [START ON 01/27/2021] insulin aspart  0-15 Units Subcutaneous TID WC   insulin aspart  0-5 Units Subcutaneous QHS   labetalol  300 mg Oral BID   lip balm  1 application Topical BID   lisinopril  40 mg Oral Daily   pantoprazole  40 mg Oral Daily   polycarbophil  625 mg Oral BID   rosuvastatin  10 mg Oral Daily   sodium chloride flush  3 mL Intravenous Q12H   sodium chloride flush  3 mL Intravenous Q12H   Continuous Infusions:  sodium chloride 125 mL/hr at 01/26/21 0459   sodium chloride     cefTRIAXone (ROCEPHIN)  IV Stopped (01/25/21 2122)   clevidipine 1 mg/hr (01/26/21 1209)   lactated ringers     methocarbamol (ROBAXIN) IV     metronidazole 500 mg (01/26/21 0504)   PRN Meds:.sodium chloride, ALPRAZolam, diphenhydrAMINE, hydrALAZINE, HYDROmorphone (DILAUDID) injection, lactated ringers, magic mouthwash, methocarbamol (ROBAXIN) IV, methocarbamol, ondansetron (ZOFRAN) IV, prochlorperazine, sodium chloride flush, traMADol  Antimicrobials: Anti-infectives (From admission, onward)   Start     Dose/Rate Route Frequency Ordered Stop   01/26/21 0830  ceFAZolin (ANCEF) 3 g in dextrose 5 % 50 mL IVPB       "And" Linked Group Details   3 g 100 mL/hr over 30 Minutes Intravenous On call to O.R. 01/26/21 0741 01/26/21 1119   01/26/21 0830  metroNIDAZOLE (FLAGYL) IVPB 500 mg       "And" Linked Group Details   500 mg 100 mL/hr over 60 Minutes Intravenous On call to O.R. 01/26/21 0741 01/26/21 1127   01/25/21 2100  cefTRIAXone (ROCEPHIN) 2 g in sodium chloride 0.9 % 100 mL IVPB        2 g 200 mL/hr over 30 Minutes Intravenous Every 24 hours 01/25/21 2028     01/25/21 2100  metroNIDAZOLE (FLAGYL) IVPB 500 mg         500 mg 100 mL/hr over 60 Minutes Intravenous Every 8 hours 01/25/21 2028         I have personally reviewed the following labs and images: CBC: Recent Labs  Lab 01/25/21 1056 01/26/21 0422  WBC 5.4 7.5  HGB 14.5 13.5  HCT 43.7 40.9  MCV 87.8 89.3  PLT 194 188   BMP &GFR Recent Labs  Lab 01/25/21 1056 01/26/21 0422  NA 137 139  K 3.8 3.6  CL 102 104  CO2 25 24  GLUCOSE 158* 100*  BUN 23* 19  CREATININE 1.91* 1.94*  CALCIUM 9.2 8.8*   Estimated Creatinine Clearance: 64.1 mL/min (A) (by C-G formula based on SCr of 1.94 mg/dL (H)). Liver & Pancreas: Recent Labs  Lab 01/25/21 1056 01/26/21 0422  AST 29 24  ALT 40 33  ALKPHOS 66 61  BILITOT 0.6  0.7  PROT 8.2* 6.7  ALBUMIN 4.5 3.4*   Recent Labs  Lab 01/25/21 1056  LIPASE 58*   No results for input(s): AMMONIA in the last 168 hours. Diabetic: No results for input(s): HGBA1C in the last 72 hours. Recent Labs  Lab 01/26/21 0041 01/26/21 0454 01/26/21 0956 01/26/21 1328 01/26/21 1658  GLUCAP 107* 95 106* 123* 137*   Cardiac Enzymes: No results for input(s): CKTOTAL, CKMB, CKMBINDEX, TROPONINI in the last 168 hours. No results for input(s): PROBNP in the last 8760 hours. Coagulation Profile: No results for input(s): INR, PROTIME in the last 168 hours. Thyroid Function Tests: No results for input(s): TSH, T4TOTAL, FREET4, T3FREE, THYROIDAB in the last 72 hours. Lipid Profile: No results for input(s): CHOL, HDL, LDLCALC, TRIG, CHOLHDL, LDLDIRECT in the last 72 hours. Anemia Panel: No results for input(s): VITAMINB12, FOLATE, FERRITIN, TIBC, IRON, RETICCTPCT in the last 72 hours. Urine analysis:    Component Value Date/Time   COLORURINE YELLOW 01/25/2021 1056   APPEARANCEUR CLEAR 01/25/2021 1056   LABSPEC 1.020 01/25/2021 1056   PHURINE 8.0 01/25/2021 1056   GLUCOSEU 100 (A) 01/25/2021 1056   HGBUR SMALL (A) 01/25/2021 1056   BILIRUBINUR NEGATIVE 01/25/2021 [place] 01/25/2021 1056    PROTEINUR 30 (A) 01/25/2021 1056   NITRITE NEGATIVE 01/25/2021 1056   LEUKOCYTESUR NEGATIVE 01/25/2021 1056   Sepsis Labs: Invalid input(s): PROCALCITONIN, [place]  Microbiology: Recent Results (from the past 240 hour(s))  Resp Panel by RT-PCR (Flu A&B, Covid) Nasopharyngeal Swab     Status: None   Collection Time: 01/25/21  3:40 PM   Specimen: Nasopharyngeal Swab; Nasopharyngeal(NP) swabs in vial transport medium  Result Value Ref Range Status   SARS Coronavirus 2 by RT PCR NEGATIVE NEGATIVE Final    Comment: (NOTE) SARS-CoV-2 target nucleic acids are NOT DETECTED.  The SARS-CoV-2 RNA is generally detectable in upper respiratory specimens during the acute phase of infection. The lowest concentration of SARS-CoV-2 viral copies this assay can detect is 138 copies/mL. A negative result does not preclude SARS-Cov-2 infection and should not be used as the sole basis for treatment or other patient management decisions. A negative result may occur with  improper specimen collection/handling, submission of specimen other than nasopharyngeal swab, presence of viral mutation(s) within the areas targeted by this assay, and inadequate number of viral copies(<138 copies/mL). A negative result must be combined with clinical observations, patient history, and epidemiological information. The expected result is Negative.  Fact Sheet for Patients:  EntrepreneurPulse.com.au  Fact Sheet for Healthcare Providers:  IncredibleEmployment.be  This test is no t yet approved or cleared by the [place] FDA and  has been authorized for detection and/or diagnosis of SARS-CoV-2 by FDA under an Emergency Use Authorization (EUA). This EUA will remain  in effect (meaning this test can be used) for the duration of the COVID-19 declaration under Section 564(b)(1) of the Act, 21 U.S.C.section 360bbb-3(b)(1), unless the authorization is terminated  or revoked  sooner.       Influenza A by PCR NEGATIVE NEGATIVE Final   Influenza B by PCR NEGATIVE NEGATIVE Final    Comment: (NOTE) The Xpert Xpress SARS-CoV-2/FLU/RSV plus assay is intended as an aid in the diagnosis of influenza from Nasopharyngeal swab specimens and should not be used as a sole basis for treatment. Nasal washings and aspirates are unacceptable for Xpert Xpress SARS-CoV-2/FLU/RSV testing.  Fact Sheet for Patients: EntrepreneurPulse.com.au  Fact Sheet for Healthcare Providers: IncredibleEmployment.be  This test is not yet approved  or cleared by the [place] and has been authorized for detection and/or diagnosis of SARS-CoV-2 by FDA under an Emergency Use Authorization (EUA). This EUA will remain in effect (meaning this test can be used) for the duration of the COVID-19 declaration under Section 564(b)(1) of the Act, 21 U.S.C. section 360bbb-3(b)(1), unless the authorization is terminated or revoked.  Performed at [facility], [address] [place], [place] [address]     Radiology Studies: DG Cholangiogram Operative  Result Date: 01/26/2021 CLINICAL DATA:  Cholecystectomy for cholelithiasis and cholecystitis. EXAM: INTRAOPERATIVE CHOLANGIOGRAM TECHNIQUE: Cholangiographic images from the C-arm fluoroscopic device were submitted for interpretation post-operatively. Please see the procedural report for the amount of contrast and the fluoroscopy time utilized. COMPARISON:  CT of the abdomen and pelvis on 01/25/2021 FINDINGS: Intraoperative imaging with a C-arm demonstrates normal opacified bile ducts without evidence of filling defect or obstruction. Contrast is seen within the duodenum. No contrast extravasation. IMPRESSION: Unremarkable intraoperative cholangiogram. Electronically Signed   By: Aletta Edouard M.D.   On: 01/26/2021 13:37      Rishit Burkhalter T. [place]  If 7PM-7AM, please contact  night-coverage [email or web address] 01/26/2021, 6:03 PM

## 2021-01-26 NOTE — Anesthesia Preprocedure Evaluation (Addendum)
Anesthesia Evaluation  Patient identified by MRN, date of birth, ID band Patient awake    Reviewed: Allergy & Precautions, H&P , NPO status , Patient's Chart, lab work & pertinent test results, reviewed documented beta blocker date and time   Airway Mallampati: II  TM Distance: >3 FB Neck ROM: full    Dental no notable dental hx. (+) Teeth Intact, Dental Advisory Given   Pulmonary sleep apnea and Continuous Positive Airway Pressure Ventilation ,    Pulmonary exam normal breath sounds clear to auscultation       Cardiovascular Exercise Tolerance: Good hypertension, Pt. on medications and Pt. on home beta blockers negative cardio ROS   Rhythm:regular Rate:Normal     Neuro/Psych PSYCHIATRIC DISORDERS Anxiety negative neurological ROS     GI/Hepatic Neg liver ROS, GERD  Medicated,  Endo/Other  diabetes, Type 2, Oral Hypoglycemic AgentsMorbid obesity  Renal/GU CRFRenal disease  negative genitourinary   Musculoskeletal  (+) Arthritis , Osteoarthritis,    Abdominal   Peds  Hematology negative hematology ROS (+)   Anesthesia Other Findings   Reproductive/Obstetrics negative OB ROS                            Anesthesia Physical Anesthesia Plan  ASA: III  Anesthesia Plan: General   Post-op Pain Management:    Induction:   PONV Risk Score and Plan: 2  Airway Management Planned: Oral ETT  Additional Equipment: None  Intra-op Plan:   Post-operative Plan:   Informed Consent: I have reviewed the patients History and Physical, chart, labs and discussed the procedure including the risks, benefits and alternatives for the proposed anesthesia with the patient or authorized representative who has indicated his/her understanding and acceptance.     Dental Advisory Given  Plan Discussed with: CRNA  Anesthesia Plan Comments:         Anesthesia Quick Evaluation

## 2021-01-26 NOTE — Plan of Care (Signed)
  Problem: Education: Goal: Knowledge of General Education information will improve Description: Including pain rating scale, medication(s)/side effects and non-pharmacologic comfort measures 01/26/2021 0056 by Micheline Chapman, RN Outcome: Progressing 01/26/2021 0055 by Micheline Chapman, RN Outcome: Progressing   Problem: Health Behavior/Discharge Planning: Goal: Ability to manage health-related needs will improve 01/26/2021 0056 by Micheline Chapman, RN Outcome: Progressing 01/26/2021 0055 by Micheline Chapman, RN Outcome: Progressing   Problem: Clinical Measurements: Goal: Will remain free from infection 01/26/2021 0056 by Micheline Chapman, RN Outcome: Progressing 01/26/2021 0055 by Micheline Chapman, RN Outcome: Progressing Goal: Diagnostic test results will improve 01/26/2021 0056 by Micheline Chapman, RN Outcome: Progressing 01/26/2021 0055 by Micheline Chapman, RN Outcome: Progressing Goal: Respiratory complications will improve 01/26/2021 0056 by Micheline Chapman, RN Outcome: Progressing 01/26/2021 0055 by Micheline Chapman, RN Outcome: Progressing Goal: Cardiovascular complication will be avoided 01/26/2021 0056 by Micheline Chapman, RN Outcome: Progressing 01/26/2021 0055 by Micheline Chapman, RN Outcome: Progressing   Problem: Activity: Goal: Risk for activity intolerance will decrease 01/26/2021 0056 by Micheline Chapman, RN Outcome: Progressing 01/26/2021 0055 by Micheline Chapman, RN Outcome: Progressing   Problem: Nutrition: Goal: Adequate nutrition will be maintained 01/26/2021 0056 by Micheline Chapman, RN Outcome: Progressing 01/26/2021 0055 by Micheline Chapman, RN Outcome: Progressing   Problem: Coping: Goal: Level of anxiety will decrease 01/26/2021 0056 by Micheline Chapman, RN Outcome: Progressing 01/26/2021 0055 by Micheline Chapman, RN Outcome: Progressing   Problem: Elimination: Goal: Will not experience complications related to bowel motility 01/26/2021 0056 by Micheline Chapman,  RN Outcome: Progressing 01/26/2021 0055 by Micheline Chapman, RN Outcome: Progressing Goal: Will not experience complications related to urinary retention 01/26/2021 0056 by Micheline Chapman, RN Outcome: Progressing 01/26/2021 0055 by Micheline Chapman, RN Outcome: Progressing   Problem: Pain Managment: Goal: General experience of comfort will improve 01/26/2021 0056 by Micheline Chapman, RN Outcome: Progressing 01/26/2021 0055 by Micheline Chapman, RN Outcome: Progressing   Problem: Safety: Goal: Ability to remain free from injury will improve 01/26/2021 0056 by Micheline Chapman, RN Outcome: Progressing 01/26/2021 0055 by Micheline Chapman, RN Outcome: Progressing   Problem: Skin Integrity: Goal: Risk for impaired skin integrity will decrease 01/26/2021 0056 by Micheline Chapman, RN Outcome: Progressing 01/26/2021 0055 by Micheline Chapman, RN Outcome: Progressing   Problem: Education: Goal: Ability to describe self-care measures that may prevent or decrease complications (Diabetes Survival Skills Education) will improve Outcome: Progressing Goal: Individualized Educational Video(s) Outcome: Progressing   Problem: Coping: Goal: Ability to adjust to condition or change in health will improve Outcome: Progressing   Problem: Fluid Volume: Goal: Ability to maintain a balanced intake and output will improve Outcome: Progressing   Problem: Health Behavior/Discharge Planning: Goal: Ability to identify and utilize available resources and services will improve Outcome: Progressing Goal: Ability to manage health-related needs will improve Outcome: Progressing   Problem: Metabolic: Goal: Ability to maintain appropriate glucose levels will improve Outcome: Progressing   Problem: Nutritional: Goal: Maintenance of adequate nutrition will improve Outcome: Progressing Goal: Progress toward achieving an optimal weight will improve Outcome: Progressing   Problem: Skin Integrity: Goal: Risk for  impaired skin integrity will decrease Outcome: Progressing   Problem: Tissue Perfusion: Goal: Adequacy of tissue perfusion will improve Outcome: Progressing

## 2021-01-26 NOTE — Anesthesia Procedure Notes (Signed)
Procedure Name: Intubation Date/Time: 01/26/2021 11:18 AM Performed by: Eben Burow, CRNA Pre-anesthesia Checklist: Patient identified, Emergency Drugs available, Suction available, Patient being monitored and Timeout performed Patient Re-evaluated:Patient Re-evaluated prior to induction Oxygen Delivery Method: Circle system utilized Preoxygenation: Pre-oxygenation with 100% oxygen Induction Type: IV induction Ventilation: Mask ventilation without difficulty Laryngoscope Size: Mac and 4 Grade View: Grade I Tube type: Oral Tube size: 7.5 mm Number of attempts: 1 Airway Equipment and Method: Stylet Placement Confirmation: ETT inserted through vocal cords under direct vision,  positive ETCO2 and breath sounds checked- equal and bilateral Secured at: 23 cm Tube secured with: Tape Dental Injury: Teeth and Oropharynx as per pre-operative assessment

## 2021-01-26 NOTE — Transfer of Care (Signed)
Immediate Anesthesia Transfer of Care Note  Patient: Adrian Mckenzie  Procedure(s) Performed: LAPAROSCOPIC CHOLECYSTECTOMY SINGLE SITE WITH INTRAOPERATIVE CHOLANGIOGRAM (N/A Abdomen) LAPAROSCOPIC CORE LIVER BIOPSY (Liver)  Patient Location: PACU  Anesthesia Type:General  Level of Consciousness: awake, alert  and patient cooperative  Airway & Oxygen Therapy: Patient Spontanous Breathing and Patient connected to face mask oxygen  Post-op Assessment: Report given to RN and Post -op Vital signs reviewed and stable  Post vital signs: Reviewed and stable  Last Vitals:  Vitals Value Taken Time  BP 181/104 01/26/21 1248  Temp 36.6 C 01/26/21 1245  Pulse 67 01/26/21 1250  Resp 16 01/26/21 1250  SpO2 99 % 01/26/21 1250  Vitals shown include unvalidated device data.  Last Pain:  Vitals:   01/26/21 1245  TempSrc:   PainSc: 0-No pain         Complications: No complications documented.

## 2021-01-26 NOTE — Progress Notes (Signed)
Progress Note  Day of Surgery  Subjective: Patient denies abdominal pain, nausea or vomiting this AM. 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 His wife was at the bedside this AM. We discussed risks and benefits of surgery and patient is agreeable to proceed.   Objective: Vital signs in last 24 hours: Temp:  [97.6 F (36.4 C)-98.6 F (37 C)] 98.6 F (37 C) (03/16 0458) Pulse Rate:  [52-68] 68 (03/16 0458) Resp:  [6-25] 20 (03/16 0458) BP: (163-210)/(92-128) 164/92 (03/16 0458) SpO2:  [95 %-100 %] 99 % (03/16 0458) Weight:  [494 kg] 127 kg (03/15 1038)    Intake/Output from previous day: 03/15 0701 - 03/16 0700 In: 2465.9 [I.V.:981.2; IV Piggyback:1484.7] Out: -  Intake/Output this shift: No intake/output data recorded.  PE: General: pleasant, WD, overweight male who is laying in bed in NAD HEENT: Sclera are anicteric.   Heart: regular, rate, and rhythm.  Lungs: CTAB, no wheezes, rhonchi, or rales noted.  Respiratory effort nonlabored Abd: soft, NT, ND, +BS, no masses, hernias, or organomegaly MS: all 4 extremities are symmetrical with no cyanosis, clubbing, or edema. Skin: warm and dry with no masses, lesions, or rashes Neuro: Cranial nerves 2-12 grossly intact, sensation is normal throughout Psych: A&Ox3 with an appropriate affect.    Lab Results:  Recent Labs    01/25/21 1056 01/26/21 0422  WBC 5.4 7.5  HGB 14.5 13.5  HCT 43.7 40.9  PLT 194 188   BMET Recent Labs    01/25/21 1056 01/26/21 0422  NA 137 139  K 3.8 3.6  CL 102 104  CO2 25 24  GLUCOSE 158* 100*  BUN 23* 19  CREATININE 1.91* 1.94*  CALCIUM 9.2 8.8*   PT/INR No results for input(s): LABPROT, INR in the last 72 hours. CMP     Component Value Date/Time   NA 139 01/26/2021 0422   K 3.6 01/26/2021 0422   CL 104 01/26/2021 0422   CO2 24 01/26/2021 0422   GLUCOSE 100 (H) 01/26/2021 0422   BUN 19 01/26/2021 0422    CREATININE 1.94 (H) 01/26/2021 0422   CALCIUM 8.8 (L) 01/26/2021 0422   PROT 6.7 01/26/2021 0422   ALBUMIN 3.4 (L) 01/26/2021 0422   AST 24 01/26/2021 0422   ALT 33 01/26/2021 0422   ALKPHOS 61 01/26/2021 0422   BILITOT 0.7 01/26/2021 0422   GFRNONAA 41 (L) 01/26/2021 0422   Lipase     Component Value Date/Time   LIPASE 58 (H) 01/25/2021 1056       Studies/Results: CT ABDOMEN PELVIS W CONTRAST  Result Date: 01/25/2021 CLINICAL DATA:  Epigastric pain/abdominal pain. Concern for pancreatitis. EXAM: CT ABDOMEN AND PELVIS WITH CONTRAST TECHNIQUE: Multidetector CT imaging of the abdomen and pelvis was performed using the standard protocol following bolus administration of intravenous contrast. CONTRAST:  92mL OMNIPAQUE IOHEXOL 300 MG/ML  SOLN COMPARISON:  06/25/2019 FINDINGS: Lower chest: Linear scarring at the left base. Heart is borderline in size. No effusions. Hepatobiliary: Cholelithiasis.  No focal hepatic abnormality. Pancreas: No focal abnormality or ductal dilatation. No surrounding inflammation. Spleen: No focal abnormality.  Normal size. Adrenals/Urinary Tract: No adrenal abnormality. No focal renal abnormality. No stones or hydronephrosis. Urinary bladder is unremarkable. Stomach/Bowel: Normal appendix. Stomach, large and small bowel grossly unremarkable. Vascular/Lymphatic: Aortic atherosclerosis. No evidence of aneurysm or adenopathy. Reproductive: Lower pelvic structures obscured by beam hardening artifact from bilateral hip replacements. Other: No free  fluid or free air. Musculoskeletal: Bilateral hip replacements. No acute bony abnormality. Lucent lesion within the left iliac bone with surrounding sclerosis is again noted and unchanged since prior study. This has a benign appearance. IMPRESSION: Cholelithiasis. No CT evidence of pancreatitis. Aortic atherosclerosis.  Borderline cardiomegaly. No acute findings. Electronically Signed   By: Rolm Baptise M.D.   On: 01/25/2021 14:58    DG Chest Port 1 View  Result Date: 01/25/2021 CLINICAL DATA:  Chest pain EXAM: PORTABLE CHEST 1 VIEW COMPARISON:  06/25/2019 FINDINGS: Cardiac enlargement. Negative for heart failure or edema. No significant pleural effusion. Negative for pneumonia. IMPRESSION: Cardiac enlargement.  No acute abnormality. Electronically Signed   By: Franchot Gallo M.D.   On: 01/25/2021 12:33   US Abdomen Limited RUQ (LIVER/GB)  Result Date: 01/25/2021 CLINICAL DATA:  Right upper abdominal pain with nausea and vomiting EXAM: ULTRASOUND ABDOMEN LIMITED RIGHT UPPER QUADRANT COMPARISON:  CT abdomen and pelvis June 25, 2019 FINDINGS: Gallbladder: Within the gallbladder, there are echogenic foci which move and shadow consistent with cholelithiasis. Largest gallstone measures 1.6 cm in length. Gallbladder wall thickness is upper normal. No pericholecystic fluid. Patient is focally tender over the gallbladder. Common bile duct: Diameter: 3 mm. No intrahepatic or extrahepatic biliary duct dilatation. Liver: No focal lesion identified. Within normal limits in parenchymal echogenicity. Portal vein is patent on color Doppler imaging with normal direction of blood flow towards the liver. Other: None. IMPRESSION: Cholelithiasis with gallbladder wall thickness upper normal. Patient is focally tender over the gallbladder. These findings raise concern for potential degree of acute cholecystitis. This finding may warrant nuclear medicine hepatobiliary gene study to assess for cystic duct patency. Study otherwise unremarkable. Electronically Signed   By: Lowella Grip III M.D.   On: 01/25/2021 11:53    Anti-infectives: Anti-infectives (From admission, onward)   Start     Dose/Rate Route Frequency Ordered Stop   01/26/21 0830  ceFAZolin (ANCEF) 3 g in dextrose 5 % 50 mL IVPB       "And" Linked Group Details   3 g 100 mL/hr over 30 Minutes Intravenous On call to O.R. 01/26/21 0741 01/27/21 0559   01/26/21 0830  metroNIDAZOLE  (FLAGYL) IVPB 500 mg       "And" Linked Group Details   500 mg 100 mL/hr over 60 Minutes Intravenous On call to O.R. 01/26/21 0741 01/27/21 0559   01/25/21 2100  cefTRIAXone (ROCEPHIN) 2 g in sodium chloride 0.9 % 100 mL IVPB        2 g 200 mL/hr over 30 Minutes Intravenous Every 24 hours 01/25/21 2028     01/25/21 2100  metroNIDAZOLE (FLAGYL) IVPB 500 mg        500 mg 100 mL/hr over 60 Minutes Intravenous Every 8 hours 01/25/21 2028         Assessment/Plan HTN T2DM  Acute cholecystitis  - pain resolved since starting abx - second GB attack in less than 1 month - LFTs within normal limits - risks and benefits of surgery discussed with patient and patient wishes to proceed with laparoscopic cholecystectomy  - to OR today   FEN: NPO, IVF VTE: SCDs ID: [email or web address] 3/15>>  LOS: 0 days    Norm Parcel , [facility] Surgery 01/26/2021, 9:30 AM Please see Amion for pager number during day hours 7:00am-4:30pm

## 2021-01-26 NOTE — Plan of Care (Signed)

## 2021-01-26 NOTE — Anesthesia Postprocedure Evaluation (Signed)
Anesthesia Post Note  Patient: Adrian Mckenzie  Procedure(s) Performed: LAPAROSCOPIC CHOLECYSTECTOMY SINGLE SITE WITH INTRAOPERATIVE CHOLANGIOGRAM (N/A Abdomen) LAPAROSCOPIC CORE LIVER BIOPSY (Liver)     Patient location during evaluation: PACU Anesthesia Type: General Level of consciousness: awake and alert Pain management: pain level controlled Vital Signs Assessment: post-procedure vital signs reviewed and stable Respiratory status: spontaneous breathing, nonlabored ventilation, respiratory function stable and patient connected to nasal cannula oxygen Cardiovascular status: blood pressure returned to baseline and stable Postop Assessment: no apparent nausea or vomiting Anesthetic complications: no   No complications documented.  Last Vitals:  Vitals:   01/26/21 1400 01/26/21 1414  BP: (!) 183/107 (!) 198/109  Pulse: (!) 56 (!) 55  Resp: 17   Temp: 36.7 C   SpO2: 98% 99%    Last Pain:  Vitals:   01/26/21 1400  TempSrc:   PainSc: 0-No pain                 [name]

## 2021-01-26 NOTE — Progress Notes (Signed)
Pt has declined CPAP QHS at this time, Pt to notify RT if he decides to wear.

## 2021-01-27 ENCOUNTER — Encounter (HOSPITAL_COMMUNITY): Payer: Self-pay | Admitting: Surgery

## 2021-01-27 DIAGNOSIS — I10 Essential (primary) hypertension: Secondary | ICD-10-CM | POA: Diagnosis not present

## 2021-01-27 DIAGNOSIS — K8000 Calculus of gallbladder with acute cholecystitis without obstruction: Secondary | ICD-10-CM | POA: Diagnosis not present

## 2021-01-27 DIAGNOSIS — R739 Hyperglycemia, unspecified: Secondary | ICD-10-CM | POA: Diagnosis not present

## 2021-01-27 DIAGNOSIS — E1165 Type 2 diabetes mellitus with hyperglycemia: Secondary | ICD-10-CM

## 2021-01-27 DIAGNOSIS — E669 Obesity, unspecified: Secondary | ICD-10-CM

## 2021-01-27 DIAGNOSIS — G4733 Obstructive sleep apnea (adult) (pediatric): Secondary | ICD-10-CM

## 2021-01-27 DIAGNOSIS — K219 Gastro-esophageal reflux disease without esophagitis: Secondary | ICD-10-CM | POA: Diagnosis not present

## 2021-01-27 LAB — CBC
HCT: 40.3 % (ref 39.0–52.0)
Hemoglobin: 13.4 g/dL (ref 13.0–17.0)
MCH: 29.6 pg (ref 26.0–34.0)
MCHC: 33.3 g/dL (ref 30.0–36.0)
MCV: 89.2 fL (ref 80.0–100.0)
Platelets: 179 10*3/uL (ref 150–400)
RBC: 4.52 MIL/uL (ref 4.22–5.81)
RDW: 14.9 % (ref 11.5–15.5)
WBC: 7.6 10*3/uL (ref 4.0–10.5)
nRBC: 0 % (ref 0.0–0.2)

## 2021-01-27 LAB — COMPREHENSIVE METABOLIC PANEL
ALT: 52 U/L — ABNORMAL HIGH (ref 0–44)
AST: 49 U/L — ABNORMAL HIGH (ref 15–41)
Albumin: 3.7 g/dL (ref 3.5–5.0)
Alkaline Phosphatase: 63 U/L (ref 38–126)
Anion gap: 6 (ref 5–15)
BUN: 22 mg/dL — ABNORMAL HIGH (ref 6–20)
CO2: 28 mmol/L (ref 22–32)
Calcium: 9 mg/dL (ref 8.9–10.3)
Chloride: 104 mmol/L (ref 98–111)
Creatinine, Ser: 2.08 mg/dL — ABNORMAL HIGH (ref 0.61–1.24)
GFR, Estimated: 37 mL/min — ABNORMAL LOW (ref >60)
Glucose, Bld: 112 mg/dL — ABNORMAL HIGH (ref 70–99)
Potassium: 3.8 mmol/L (ref 3.5–5.1)
Sodium: 138 mmol/L (ref 135–145)
Total Bilirubin: 0.6 mg/dL (ref 0.3–1.2)
Total Protein: 7 g/dL (ref 6.5–8.1)

## 2021-01-27 LAB — LIPID PANEL
Cholesterol: 148 mg/dL (ref 0–200)
HDL: 62 mg/dL (ref >40)
LDL Cholesterol: 75 mg/dL (ref 0–99)
Total CHOL/HDL Ratio: 2.4 RATIO
Triglycerides: 55 mg/dL (ref <150)
VLDL: 11 mg/dL (ref 0–40)

## 2021-01-27 LAB — HEMOGLOBIN A1C
Hgb A1c MFr Bld: 6.3 % — ABNORMAL HIGH (ref 4.8–5.6)
Mean Plasma Glucose: 134.11 mg/dL

## 2021-01-27 LAB — MAGNESIUM: Magnesium: 1.9 mg/dL (ref 1.7–2.4)

## 2021-01-27 LAB — LIPASE, BLOOD: Lipase: 30 U/L (ref 11–51)

## 2021-01-27 LAB — GLUCOSE, CAPILLARY
Glucose-Capillary: 128 mg/dL — ABNORMAL HIGH (ref 70–99)
Glucose-Capillary: 97 mg/dL (ref 70–99)

## 2021-01-27 LAB — PHOSPHORUS: Phosphorus: 3.9 mg/dL (ref 2.5–4.6)

## 2021-01-27 LAB — SURGICAL PATHOLOGY

## 2021-01-27 MED ORDER — HYDRALAZINE HCL 50 MG PO TABS: 50.0000 mg | ORAL_TABLET | Freq: Three times a day (TID) | ORAL | Status: DC

## 2021-01-27 MED ORDER — HEPARIN SODIUM (PORCINE) 5000 UNIT/ML IJ SOLN: 5000.0000 [IU] | Freq: Three times a day (TID) | INTRAMUSCULAR | Status: DC

## 2021-01-27 MED ORDER — TRAMADOL HCL 50 MG PO TABS: 50.0000 mg | ORAL_TABLET | Freq: Four times a day (QID) | ORAL | 0 refills | Status: AC | PRN

## 2021-01-27 MED ORDER — ACETAMINOPHEN 500 MG PO TABS: 1000.0000 mg | ORAL_TABLET | Freq: Three times a day (TID) | ORAL | Status: AC | PRN

## 2021-01-27 NOTE — Discharge Instructions (Signed)
[facility] LAPAROSCOPIC SURGERY: POST OP INSTRUCTIONS Always review your discharge instruction sheet given to you by the facility where your surgery was performed. IF YOU HAVE DISABILITY OR FAMILY LEAVE FORMS, YOU MUST BRING THEM TO THE OFFICE FOR PROCESSING.   DO NOT GIVE THEM TO YOUR DOCTOR.  PAIN CONTROL  1. First take acetaminophen (Tylenol) AND/or ibuprofen (Advil) to control your pain after surgery.  Follow directions on package.  Taking acetaminophen (Tylenol) and/or ibuprofen (Advil) regularly after surgery will help to control your pain and lower the amount of prescription pain medication you may need.  You should not take more than 3,000 mg (3 grams) of acetaminophen (Tylenol) in 24 hours.  You should not take ibuprofen (Advil), aleve, motrin, naprosyn or other NSAIDS if you have a history of stomach ulcers or chronic kidney disease.  2. A prescription for pain medication may be given to you upon discharge.  Take your pain medication as prescribed, if you still have uncontrolled pain after taking acetaminophen (Tylenol) or ibuprofen (Advil). 3. Use ice packs to help control pain. 4. If you need a refill on your pain medication, please contact your pharmacy.  They will contact our office to request authorization. Prescriptions will not be filled after 5pm or on week-ends.  HOME MEDICATIONS 5. Take your usually prescribed medications unless otherwise directed.  DIET 6. You should follow a light diet the first few days after arrival home.  Be sure to include lots of fluids daily. Avoid fatty, fried foods.   CONSTIPATION 7. It is common to experience some constipation after surgery and if you are taking pain medication.  Increasing fluid intake and taking a stool softener (such as Colace) will usually help or prevent this problem from occurring.  A mild laxative (Milk of Magnesia or Miralax) should be taken according to package instructions if there are no bowel  movements after 48 hours.  WOUND/INCISION CARE 8. Most patients will experience some swelling and bruising in the area of the incisions.  Ice packs will help.  Swelling and bruising can take several days to resolve.  9. Unless discharge instructions indicate otherwise, follow guidelines below  a. STERI-STRIPS - you may remove your outer bandages 48 hours after surgery, and you may shower at that time.  You have steri-strips (small skin tapes) in place directly over the incision.  These strips should be left on the skin for 7-10 days.   b. DERMABOND/SKIN GLUE - you may shower in 24 hours.  The glue will flake off over the next 2-3 weeks. 10. Any sutures or staples will be removed at the office during your follow-up visit.  ACTIVITIES 11. You may resume regular (light) daily activities beginning the next day--such as daily self-care, walking, climbing stairs--gradually increasing activities as tolerated.  You may have sexual intercourse when it is comfortable.  Refrain from any heavy lifting or straining until approved by your doctor. a. You may drive when you are no longer taking prescription pain medication, you can comfortably wear a seatbelt, and you can safely maneuver your car and apply brakes.  FOLLOW-UP 12. You should see your doctor in the office for a follow-up appointment approximately 2-3 weeks after your surgery.  You should have been given your post-op/follow-up appointment when your surgery was scheduled.  If you did not receive a post-op/follow-up appointment, make sure that you call for this appointment within a day or two after you arrive home to insure a convenient appointment time.     WHEN TO CALL YOUR DOCTOR: 1. Fever over 101.0 2. Inability to urinate 3. Continued bleeding from incision. 4. Increased pain, redness, or drainage from the incision. 5. Increasing abdominal pain  The clinic staff is available to answer your questions during regular business hours.  Please don't  hesitate to call and ask to speak to one of the nurses for clinical concerns.  If you have a medical emergency, go to the nearest emergency room or call 911.  A surgeon from [facility] is always on call at the hospital. [address], [address], [place], [place]  [address] ? [address] [address], [place], [place]   [address] [phone] ? [phone] ? FAX [phone] Web site: [email or web address]  .........   Managing Your Pain After Surgery Without Opioids    Thank you for participating in our program to help patients manage their pain after surgery without opioids. This is part of our effort to provide you with the best care possible, without exposing you or your family to the risk that opioids pose.  What pain can I expect after surgery? You can expect to have some pain after surgery. This is normal. The pain is typically worse the day after surgery, and quickly begins to get better. Many studies have found that many patients are able to manage their pain after surgery with Over-the-Counter (OTC) medications such as Tylenol and Motrin. If you have a condition that does not allow you to take Tylenol or Motrin, notify your surgical team.  How will I manage my pain? The best strategy for controlling your pain after surgery is around the clock pain control with Tylenol (acetaminophen) and Motrin (ibuprofen or Advil). Alternating these medications with each other allows you to maximize your pain control. In addition to Tylenol and Motrin, you can use heating pads or ice packs on your incisions to help reduce your pain.  How will I alternate your regular strength over-the-counter pain medication? You will take a dose of pain medication every three hours. ; Start by taking 650 mg of Tylenol (2 pills of 325 mg) ; 3 hours later take 600 mg of Motrin (3 pills of 200 mg) ; 3 hours after taking the Motrin take 650 mg of Tylenol ; 3 hours after that take 600 mg of  Motrin.   - 1 -  See example - if your first dose of Tylenol is at 12:00 PM   12:00 PM Tylenol 650 mg (2 pills of 325 mg)  3:00 PM Motrin 600 mg (3 pills of 200 mg)  6:00 PM Tylenol 650 mg (2 pills of 325 mg)  9:00 PM Motrin 600 mg (3 pills of 200 mg)  Continue alternating every 3 hours   We recommend that you follow this schedule around-the-clock for at least 3 days after surgery, or until you feel that it is no longer needed. Use the table on the last page of this handout to keep track of the medications you are taking. Important: Do not take more than 3000mg of Tylenol or 3200mg of Motrin in a 24-hour period. Do not take ibuprofen/Motrin if you have a history of bleeding stomach ulcers, severe kidney disease, &/or actively taking a blood thinner  What if I still have pain? If you have pain that is not controlled with the over-the-counter pain medications (Tylenol and Motrin or Advil) you might have what we call "breakthrough" pain. You will receive a prescription for a small amount of an opioid pain medication such as   Oxycodone, Tramadol, or Tylenol with Codeine. Use these opioid pills in the first 24 hours after surgery if you have breakthrough pain. Do not take more than 1 pill every 4-6 hours.  If you still have uncontrolled pain after using all opioid pills, don't hesitate to call our staff using the number provided. We will help make sure you are managing your pain in the best way possible, and if necessary, we can provide a prescription for additional pain medication.   Day 1    Time  Name of Medication Number of pills taken  Amount of Acetaminophen  Pain Level   Comments  AM PM       AM PM       AM PM       AM PM       AM PM       AM PM       AM PM       AM PM       Total Daily amount of Acetaminophen Do not take more than  3,000 mg per day      Day 2    Time  Name of Medication Number of pills taken  Amount of Acetaminophen  Pain Level   Comments  AM  PM       AM PM       AM PM       AM PM       AM PM       AM PM       AM PM       AM PM       Total Daily amount of Acetaminophen Do not take more than  3,000 mg per day      Day 3    Time  Name of Medication Number of pills taken  Amount of Acetaminophen  Pain Level   Comments  AM PM       AM PM       AM PM       AM PM          AM PM       AM PM       AM PM       AM PM       Total Daily amount of Acetaminophen Do not take more than  3,000 mg per day      Day 4    Time  Name of Medication Number of pills taken  Amount of Acetaminophen  Pain Level   Comments  AM PM       AM PM       AM PM       AM PM       AM PM       AM PM       AM PM       AM PM       Total Daily amount of Acetaminophen Do not take more than  3,000 mg per day      Day 5    Time  Name of Medication Number of pills taken  Amount of Acetaminophen  Pain Level   Comments  AM PM       AM PM       AM PM       AM PM       AM PM       AM PM       AM PM         AM PM       Total Daily amount of Acetaminophen Do not take more than  3,000 mg per day       Day 6    Time  Name of Medication Number of pills taken  Amount of Acetaminophen  Pain Level  Comments  AM PM       AM PM       AM PM       AM PM       AM PM       AM PM       AM PM       AM PM       Total Daily amount of Acetaminophen Do not take more than  3,000 mg per day      Day 7    Time  Name of Medication Number of pills taken  Amount of Acetaminophen  Pain Level   Comments  AM PM       AM PM       AM PM       AM PM       AM PM       AM PM       AM PM       AM PM       Total Daily amount of Acetaminophen Do not take more than  3,000 mg per day        For additional information about how and where to safely dispose of unused opioid medications - RoleLink.com.br  Disclaimer: This document contains information and/or instructional materials adapted from [place]  for the typical patient with your condition. It does not replace medical advice from your health care provider because your experience may differ from that of the typical patient. Talk to your health care provider if you have any questions about this document, your condition or your treatment plan. Adapted from [employer]     [email or web address]">  DASH Eating Plan DASH stands for Dietary Approaches to Stop Hypertension. The DASH eating plan is a healthy eating plan that has been shown to:  Reduce high blood pressure (hypertension).  Reduce your risk for type 2 diabetes, heart disease, and stroke.  Help with weight loss. What are tips for following this plan? Reading food labels  Check food labels for the amount of salt (sodium) per serving. Choose foods with less than 5 percent of the Daily Value of sodium. Generally, foods with less than 300 milligrams (mg) of sodium per serving fit into this eating plan.  To find whole grains, look for the word "whole" as the first word in the ingredient list. Shopping  Buy products labeled as "low-sodium" or "no salt added."  Buy fresh foods. Avoid canned foods and pre-made or frozen meals. Cooking  Avoid adding salt when cooking. Use salt-free seasonings or herbs instead of table salt or sea salt. Check with your health care provider or pharmacist before using salt substitutes.  Do not fry foods. Cook foods using healthy methods such as baking, boiling, grilling, roasting, and broiling instead.  Cook with heart-healthy oils, such as olive, canola, avocado, soybean, or sunflower oil. Meal planning  Eat a balanced diet that includes: ? 4 or more servings of fruits and 4 or more servings of vegetables each day. Try to fill one-half of your plate with fruits and vegetables. ? 6-8 servings of whole grains each day. ? Less than 6 oz (170 g) of lean meat, poultry, or fish each  day. A 3-oz (85-g)  serving of meat is about the same size as a deck of cards. One egg equals 1 oz (28 g). ? 2-3 servings of low-fat dairy each day. One serving is 1 cup (237 mL). ? 1 serving of nuts, seeds, or beans 5 times each week. ? 2-3 servings of heart-healthy fats. Healthy fats called omega-3 fatty acids are found in foods such as walnuts, flaxseeds, fortified milks, and eggs. These fats are also found in cold-water fish, such as sardines, salmon, and mackerel.  Limit how much you eat of: ? Canned or prepackaged foods. ? Food that is high in trans fat, such as some fried foods. ? Food that is high in saturated fat, such as fatty meat. ? Desserts and other sweets, sugary drinks, and other foods with added sugar. ? Full-fat dairy products.  Do not salt foods before eating.  Do not eat more than 4 egg yolks a week.  Try to eat at least 2 vegetarian meals a week.  Eat more home-cooked food and less restaurant, buffet, and fast food.   Lifestyle  When eating at a restaurant, ask that your food be prepared with less salt or no salt, if possible.  If you drink alcohol: ? Limit how much you use to:  0-1 drink a day for women who are not pregnant.  0-2 drinks a day for men. ? Be aware of how much alcohol is in your drink. In the [place] one drink equals one 12 oz bottle of beer (355 mL), one 5 oz glass of wine (148 mL), or one 1 oz glass of hard liquor (44 mL). General information  Avoid eating more than 2,300 mg of salt a day. If you have hypertension, you may need to reduce your sodium intake to 1,500 mg a day.  Work with your health care provider to maintain a healthy body weight or to lose weight. Ask what an ideal weight is for you.  Get at least 30 minutes of exercise that causes your heart to beat faster (aerobic exercise) most days of the week. Activities may include walking, swimming, or biking.  Work with your health care provider or dietitian to adjust your eating plan to your individual  calorie needs. What foods should I eat? Fruits All fresh, dried, or frozen fruit. Canned fruit in natural juice (without added sugar). Vegetables Fresh or frozen vegetables (raw, steamed, roasted, or grilled). Low-sodium or reduced-sodium tomato and vegetable juice. Low-sodium or reduced-sodium tomato sauce and tomato paste. Low-sodium or reduced-sodium canned vegetables. Grains Whole-grain or whole-wheat bread. Whole-grain or whole-wheat pasta. Brown rice. [place]row. Bulgur. Whole-grain and low-sodium cereals. Pita bread. Low-fat, low-sodium crackers. Whole-wheat flour tortillas. Meats and other proteins Skinless chicken or [place]. Ground chicken or [place]. Pork with fat trimmed off. Fish and seafood. Egg whites. Dried beans, peas, or lentils. Unsalted nuts, nut butters, and seeds. Unsalted canned beans. Lean cuts of beef with fat trimmed off. Low-sodium, lean precooked or cured meat, such as sausages or meat loaves. Dairy Low-fat (1%) or fat-free (skim) milk. Reduced-fat, low-fat, or fat-free cheeses. Nonfat, low-sodium ricotta or cottage cheese. Low-fat or nonfat yogurt. Low-fat, low-sodium cheese. Fats and oils Soft margarine without trans fats. Vegetable oil. Reduced-fat, low-fat, or light mayonnaise and salad dressings (reduced-sodium). Canola, safflower, olive, avocado, soybean, and sunflower oils. Avocado. Seasonings and condiments Herbs. Spices. Seasoning mixes without salt. Other foods Unsalted popcorn and pretzels. Fat-free sweets. The items listed above may not be a complete list of foods and  beverages you can eat. Contact a dietitian for more information. What foods should I avoid? Fruits Canned fruit in a light or heavy syrup. Fried fruit. Fruit in cream or butter sauce. Vegetables Creamed or fried vegetables. Vegetables in a cheese sauce. Regular canned vegetables (not low-sodium or reduced-sodium). Regular canned tomato sauce and paste (not low-sodium or reduced-sodium).  Regular tomato and vegetable juice (not low-sodium or reduced-sodium). Angie Fava. Olives. Grains Baked goods made with fat, such as croissants, muffins, or some breads. Dry pasta or rice meal packs. Meats and other proteins Fatty cuts of meat. Ribs. Fried meat. Berniece Salines. Bologna, salami, and other precooked or cured meats, such as sausages or meat loaves. Fat from the back of a pig (fatback). Bratwurst. Salted nuts and seeds. Canned beans with added salt. Canned or smoked fish. Whole eggs or egg yolks. Chicken or [place] with skin. Dairy Whole or 2% milk, cream, and half-and-half. Whole or full-fat cream cheese. Whole-fat or sweetened yogurt. Full-fat cheese. Nondairy creamers. Whipped toppings. Processed cheese and cheese spreads. Fats and oils Butter. Stick margarine. Lard. Shortening. Ghee. Bacon fat. Tropical oils, such as coconut, palm kernel, or palm oil. Seasonings and condiments Onion salt, garlic salt, seasoned salt, table salt, and sea salt. Worcestershire sauce. Tartar sauce. Barbecue sauce. Teriyaki sauce. Soy sauce, including reduced-sodium. Steak sauce. Canned and packaged gravies. Fish sauce. Oyster sauce. Cocktail sauce. Store-bought horseradish. Ketchup. Mustard. Meat flavorings and tenderizers. Bouillon cubes. Hot sauces. Pre-made or packaged marinades. Pre-made or packaged taco seasonings. Relishes. Regular salad dressings. Other foods Salted popcorn and pretzels. The items listed above may not be a complete list of foods and beverages you should avoid. Contact a dietitian for more information. Where to find more information  National Heart, Lung, and Blood Institute: https://wilson-eaton.com/  American Heart Association: www.heart.org  Academy of Nutrition and Dietetics: www.eatright.[place]: www.kidney.org Summary  The DASH eating plan is a healthy eating plan that has been shown to reduce high blood pressure (hypertension). It may also reduce your risk for  type 2 diabetes, heart disease, and stroke.  When on the DASH eating plan, aim to eat more fresh fruits and vegetables, whole grains, lean proteins, low-fat dairy, and heart-healthy fats.  With the DASH eating plan, you should limit salt (sodium) intake to 2,300 mg a day. If you have hypertension, you may need to reduce your sodium intake to 1,500 mg a day.  Work with your health care provider or dietitian to adjust your eating plan to your individual calorie needs. This information is not intended to replace advice given to you by your health care provider. Make sure you discuss any questions you have with your health care provider. Document Revised: 10/03/2019 Document Reviewed: 10/03/2019 Elsevier Patient Education  2021 [employer].   Exercising to Stay Healthy To become healthy and stay healthy, it is recommended that you do moderate-intensity and vigorous-intensity exercise. You can tell that you are exercising at a moderate intensity if your heart starts beating faster and you start breathing faster but can still hold a conversation. You can tell that you are exercising at a vigorous intensity if you are breathing much harder and faster and cannot hold a conversation while exercising. Exercising regularly is important. It has many health benefits, such as:  Improving overall fitness, flexibility, and endurance.  Increasing bone density.  Helping with weight control.  Decreasing body fat.  Increasing muscle strength.  Reducing stress and tension.  Improving overall health. How often should I exercise? Choose  an activity that you enjoy, and set realistic goals. Your health care provider can help you make an activity plan that works for you. Exercise regularly as told by your health care provider. This may include:  Doing strength training two times a week, such as: ? Lifting weights. ? Using resistance bands. ? Push-ups. ? Sit-ups. ? Yoga.  Doing a certain intensity of  exercise for a given amount of time. Choose from these options: ? A total of 150 minutes of moderate-intensity exercise every week. ? A total of 75 minutes of vigorous-intensity exercise every week. ? A mix of moderate-intensity and vigorous-intensity exercise every week. Children, pregnant women, people who have not exercised regularly, people who are overweight, and older adults may need to talk with a health care provider about what activities are safe to do. If you have a medical condition, be sure to talk with your health care provider before you start a new exercise program. What are some exercise ideas? Moderate-intensity exercise ideas include:  Walking 1 mile (1.6 km) in about 15 minutes.  Biking.  Hiking.  Golfing.  Dancing.  Water aerobics. Vigorous-intensity exercise ideas include:  Walking 4.5 miles (7.2 km) or more in about 1 hour.  Jogging or running 5 miles (8 km) in about 1 hour.  Biking 10 miles (16.1 km) or more in about 1 hour.  Lap swimming.  Roller-skating or in-line skating.  Cross-country skiing.  Vigorous competitive sports, such as football, basketball, and soccer.  Jumping rope.  Aerobic dancing.   What are some everyday activities that can help me to get exercise?  [place] work, such as: ? Pushing a Conservation officer, nature. ? Raking and bagging leaves.  Washing your car.  Pushing a stroller.  Shoveling snow.  Gardening.  Washing windows or floors. How can I be more active in my day-to-day activities?  Use stairs instead of an elevator.  Take a walk during your lunch break.  If you drive, park your car farther away from your work or school.  If you take public transportation, get off one stop early and walk the rest of the way.  Stand up or walk around during all of your indoor phone calls.  Get up, stretch, and walk around every 30 minutes throughout the day.  Enjoy exercise with a friend. Support to continue exercising will help you keep  a regular routine of activity. What guidelines can I follow while exercising?  Before you start a new exercise program, talk with your health care provider.  Do not exercise so much that you hurt yourself, feel dizzy, or get very short of breath.  Wear comfortable clothes and wear shoes with good support.  Drink plenty of water while you exercise to prevent dehydration or heat stroke.  Work out until your breathing and your heartbeat get faster. Where to find more information  U.S. Department of Health and Human Services: BondedCompany.at  Centers for Disease Control and Prevention (CDC): http://www.wolf.info/ Summary  Exercising regularly is important. It will improve your overall fitness, flexibility, and endurance.  Regular exercise also will improve your overall health. It can help you control your weight, reduce stress, and improve your bone density.  Do not exercise so much that you hurt yourself, feel dizzy, or get very short of breath.  Before you start a new exercise program, talk with your health care provider. This information is not intended to replace advice given to you by your health care provider. Make sure you discuss any questions you  have with your health care provider. Document Revised: 10/12/2017 Document Reviewed: 09/20/2017 Elsevier Patient Education  2021 [employer].

## 2021-01-27 NOTE — Progress Notes (Signed)
Progress Note  1 Day Post-Op  Subjective: Patient tolerating liquids without nausea or vomiting. Abdomen is sore but pain well controlled. Has been ambulating but no flatus yet. Wants to go home today.   Objective: Vital signs in last 24 hours: Temp:  [97.9 F (36.6 C)-98.7 F (37.1 C)] 98.4 F (36.9 C) (03/17 0537) Pulse Rate:  [52-76] 73 (03/17 0537) Resp:  [10-22] 20 (03/17 0537) BP: (142-199)/(93-115) 168/93 (03/17 0537) SpO2:  [92 %-100 %] 98 % (03/17 0537)    Intake/Output from previous day: 03/16 0701 - 03/17 0700 In: 1673.9 [I.V.:1221.6; IV Piggyback:452.3] Out: 720 [Urine:700; Blood:20] Intake/Output this shift: No intake/output data recorded.  PE: General: pleasant, WD, overweight male who is laying in bed in NAD Heart: regular, rate, and rhythm.   Lungs: CTAB, no wheezes, rhonchi, or rales noted.  Respiratory effort nonlabored Abd: soft, appropriately ttp, ND, +BS, incisions c/d/i    Lab Results:  Recent Labs    01/26/21 0422 01/27/21 0433  WBC 7.5 7.6  HGB 13.5 13.4  HCT 40.9 40.3  PLT 188 179   BMET Recent Labs    01/26/21 0422 01/27/21 0433  NA 139 138  K 3.6 3.8  CL 104 104  CO2 24 28  GLUCOSE 100* 112*  BUN 19 22*  CREATININE 1.94* 2.08*  CALCIUM 8.8* 9.0   PT/INR No results for input(s): LABPROT, INR in the last 72 hours. CMP     Component Value Date/Time   NA 138 01/27/2021 0433   K 3.8 01/27/2021 0433   CL 104 01/27/2021 0433   CO2 28 01/27/2021 0433   GLUCOSE 112 (H) 01/27/2021 0433   BUN 22 (H) 01/27/2021 0433   CREATININE 2.08 (H) 01/27/2021 0433   CALCIUM 9.0 01/27/2021 0433   PROT 7.0 01/27/2021 0433   ALBUMIN 3.7 01/27/2021 0433   AST 49 (H) 01/27/2021 0433   ALT 52 (H) 01/27/2021 0433   ALKPHOS 63 01/27/2021 0433   BILITOT 0.6 01/27/2021 0433   GFRNONAA 37 (L) 01/27/2021 0433   Lipase     Component Value Date/Time   LIPASE 30 01/27/2021 0433       Studies/Results: DG Cholangiogram Operative  Result  Date: 01/26/2021 CLINICAL DATA:  Cholecystectomy for cholelithiasis and cholecystitis. EXAM: INTRAOPERATIVE CHOLANGIOGRAM TECHNIQUE: Cholangiographic images from the C-arm fluoroscopic device were submitted for interpretation post-operatively. Please see the procedural report for the amount of contrast and the fluoroscopy time utilized. COMPARISON:  CT of the abdomen and pelvis on 01/25/2021 FINDINGS: Intraoperative imaging with a C-arm demonstrates normal opacified bile ducts without evidence of filling defect or obstruction. Contrast is seen within the duodenum. No contrast extravasation. IMPRESSION: Unremarkable intraoperative cholangiogram. Electronically Signed   By: Aletta Edouard M.D.   On: 01/26/2021 13:37   CT ABDOMEN PELVIS W CONTRAST  Result Date: 01/25/2021 CLINICAL DATA:  Epigastric pain/abdominal pain. Concern for pancreatitis. EXAM: CT ABDOMEN AND PELVIS WITH CONTRAST TECHNIQUE: Multidetector CT imaging of the abdomen and pelvis was performed using the standard protocol following bolus administration of intravenous contrast. CONTRAST:  27mL OMNIPAQUE IOHEXOL 300 MG/ML  SOLN COMPARISON:  06/25/2019 FINDINGS: Lower chest: Linear scarring at the left base. Heart is borderline in size. No effusions. Hepatobiliary: Cholelithiasis.  No focal hepatic abnormality. Pancreas: No focal abnormality or ductal dilatation. No surrounding inflammation. Spleen: No focal abnormality.  Normal size. Adrenals/Urinary Tract: No adrenal abnormality. No focal renal abnormality. No stones or hydronephrosis. Urinary bladder is unremarkable. Stomach/Bowel: Normal appendix. Stomach, large and small bowel grossly unremarkable.  Vascular/Lymphatic: Aortic atherosclerosis. No evidence of aneurysm or adenopathy. Reproductive: Lower pelvic structures obscured by beam hardening artifact from bilateral hip replacements. Other: No free fluid or free air. Musculoskeletal: Bilateral hip replacements. No acute bony abnormality. Lucent  lesion within the left iliac bone with surrounding sclerosis is again noted and unchanged since prior study. This has a benign appearance. IMPRESSION: Cholelithiasis. No CT evidence of pancreatitis. Aortic atherosclerosis.  Borderline cardiomegaly. No acute findings. Electronically Signed   By: Rolm Baptise M.D.   On: 01/25/2021 14:58   DG Chest Port 1 View  Result Date: 01/25/2021 CLINICAL DATA:  Chest pain EXAM: PORTABLE CHEST 1 VIEW COMPARISON:  06/25/2019 FINDINGS: Cardiac enlargement. Negative for heart failure or edema. No significant pleural effusion. Negative for pneumonia. IMPRESSION: Cardiac enlargement.  No acute abnormality. Electronically Signed   By: Franchot Gallo M.D.   On: 01/25/2021 12:33   US Abdomen Limited RUQ (LIVER/GB)  Result Date: 01/25/2021 CLINICAL DATA:  Right upper abdominal pain with nausea and vomiting EXAM: ULTRASOUND ABDOMEN LIMITED RIGHT UPPER QUADRANT COMPARISON:  CT abdomen and pelvis June 25, 2019 FINDINGS: Gallbladder: Within the gallbladder, there are echogenic foci which move and shadow consistent with cholelithiasis. Largest gallstone measures 1.6 cm in length. Gallbladder wall thickness is upper normal. No pericholecystic fluid. Patient is focally tender over the gallbladder. Common bile duct: Diameter: 3 mm. No intrahepatic or extrahepatic biliary duct dilatation. Liver: No focal lesion identified. Within normal limits in parenchymal echogenicity. Portal vein is patent on color Doppler imaging with normal direction of blood flow towards the liver. Other: None. IMPRESSION: Cholelithiasis with gallbladder wall thickness upper normal. Patient is focally tender over the gallbladder. These findings raise concern for potential degree of acute cholecystitis. This finding may warrant nuclear medicine hepatobiliary gene study to assess for cystic duct patency. Study otherwise unremarkable. Electronically Signed   By: Lowella Grip III M.D.   On: 01/25/2021 11:53     Anti-infectives: Anti-infectives (From admission, onward)   Start     Dose/Rate Route Frequency Ordered Stop   01/26/21 0830  ceFAZolin (ANCEF) 3 g in dextrose 5 % 50 mL IVPB       "And" Linked Group Details   3 g 100 mL/hr over 30 Minutes Intravenous On call to O.R. 01/26/21 0741 01/26/21 1119   01/26/21 0830  metroNIDAZOLE (FLAGYL) IVPB 500 mg       "And" Linked Group Details   500 mg 100 mL/hr over 60 Minutes Intravenous On call to O.R. 01/26/21 0741 01/26/21 1127   01/25/21 2100  cefTRIAXone (ROCEPHIN) 2 g in sodium chloride 0.9 % 100 mL IVPB        2 g 200 mL/hr over 30 Minutes Intravenous Every 24 hours 01/25/21 2028     01/25/21 2100  metroNIDAZOLE (FLAGYL) IVPB 500 mg        500 mg 100 mL/hr over 60 Minutes Intravenous Every 8 hours 01/25/21 2028         Assessment/Plan HTN T2DM CKD - above per [facility] -   Acute cholecystitis  S/p single site laparoscopic cholecystectomy with [facility] 01/26/21 Dr. Johney Maine - POD#1 - denies nausea and pain well controlled - advance diet - continue mobilization - stable for discharge from a surgical standpoint when medically cleared  FEN: HH/CM diet VTE: SCDs, SQH ID: [email or web address]/flagyl 3/15>> NO further abx needed upon discharge  LOS: 0 days    Norm Parcel , [facility] 01/27/2021, 9:19 AM Please see Amion for pager number during  day hours 7:00am-4:30pm

## 2021-01-27 NOTE — Discharge Summary (Signed)
Physician Discharge Summary  Adrian Mckenzie MPN:[id number] DOB: 02-Aug-1966 DOA: 01/25/2021  PCP: Beckie Salts, MD  Admit date: 01/25/2021 Discharge date: 01/27/2021  Admitted From: Home Disposition: Home  Recommendations for Outpatient Follow-up:  1. Follow ups as below. 2. Please obtain CBC/BMP/Mag at follow up 3. Please follow up on the following pending results: Liver biopsy results  Home Health: None required Equipment/Devices: None required  Discharge Condition: Stable CODE STATUS: Full code   Follow-up Information    [facility]. Go on 02/17/2021.   Specialty: General Surgery Why: Follow up appointment scheduled for 2:45 PM. Please arrive 30 min prior to appointment time. Bring photo ID and insurance information with you.  Contact information: [place] [address] [phone]        Beckie Salts, MD. Schedule an appointment as soon as possible for a visit in 1 week(s).   Specialty: Internal Medicine               Hospital Course: 55 year old M with PMH of CKD-4, HTN, HLD, OSA, DM-2, obesity and GERD presenting with acute RUQ and epigastric abdominal pain, nausea and vomiting, and admitted for acute calculus cholecystitis and pancreatitis, and uncontrolled hypertension.  Patient underwent laparoscopic cholecystectomy with intraoperative cholangiogram, and core liver biopsy on 01/26/2021.  Patient remained stable postop other than elevated blood pressure that has improved.  He was cleared for discharge from surgical standpoint.  Discharged in stable condition to follow-up with general surgery, PCP and nephrology.   See individual problem list below for more on hospital course.  Discharge Diagnoses:  Acute calculus cholecystitis Acute calculus pancreatitis?  LFT and total bili within normal. -S/p laparoscopic cholecystectomy -Received ceftriaxone and metronidazole perioperatively. -Cleared for discharge by general  surgery -Outpatient follow-up with general surgery.  Hepatic steatosis -S/p core liver biopsy by general surgery -Follow-up pathology  Uncontrolled hypertension: BP elevated but improved.  -Continue home amlodipine, labetalol, lisinopril and amiloride -Outpatient follow-up with PCP and his nephrologist -Counseled on the importance of wearing his CPAP and lifestyle change  NIDDM-2 with hyperlipidemia, hyperglycemia and CKD-4: A1c 6.3%. Recent Labs  Lab 01/26/21 1328 01/26/21 1658 01/26/21 2013 01/27/21 0732 01/27/21 1133  GLUCAP 123* 137* 121* 128* 97  -Discharged on home medications   CKD-4: Cr slightly up Recent Labs    01/25/21 1056 01/26/21 0422 01/27/21 0433  BUN 23* 19 22*  CREATININE 1.91* 1.94* 2.08*  -Recheck in 1 to 2 weeks -Outpatient follow-up with PCP and nephrologist  OSA: Uses CPAP at home.  -Encouraged CPAP here.   Class I obesity Body mass index is 34.08 kg/m.  -Encourage lifestyle change to lose weight          Discharge Exam: Vitals:   01/27/21 0005 01/27/21 0537  BP: (!) 161/95 (!) 168/93  Pulse: 66 73  Resp: 20 20  Temp: 98.6 F (37 C) 98.4 F (36.9 C)  SpO2: 100% 98%    GENERAL: No apparent distress.  Nontoxic. HEENT: MMM.  Vision and hearing grossly intact.  NECK: Supple.  No apparent JVD.  RESP: On RA.  No IWOB.  Fair aeration bilaterally. CVS:  RRR. Heart sounds normal.  ABD/GI/GU: Bowel sounds present. Soft. Non tender.  MSK/EXT:  Moves extremities. No apparent deformity. No edema.  SKIN: no apparent skin lesion or wound NEURO: Awake, alert and oriented appropriately.  No apparent focal neuro deficit. PSYCH: Calm. Normal affect.  Discharge Instructions  Discharge Instructions    Call MD for:  difficulty breathing,  headache or visual disturbances   Complete by: As directed    Call MD for:  extreme fatigue   Complete by: As directed    Call MD for:  persistant dizziness or light-headedness   Complete by: As  directed    Call MD for:  persistant nausea and vomiting   Complete by: As directed    Call MD for:  severe uncontrolled pain   Complete by: As directed    Diet - low sodium heart healthy   Complete by: As directed    Diet Carb Modified   Complete by: As directed    Discharge instructions   Complete by: As directed    It has been a pleasure taking care of you!  You were hospitalized due to gallbladder infection and gallstone.  You were treated surgically medically.  Your blood pressure is also elevated.  Continue taking your blood pressure medication, and using your CPAP at night.  We also recommend lifestyle change including diet and exercise.  Please see a separate discharge instruction for diet and exercise. Follow-up with your surgeon, primary care doctor and nephrologist for further evaluation on this.    Take care,   Increase activity slowly   Complete by: As directed    No dressing needed   Complete by: As directed      Allergies as of 01/27/2021      Reactions   Nsaids Other (See Comments)   CKD IV kidney disease   Atorvastatin Other (See Comments)   Hydralazine Other (See Comments)   Fatigue.      Medication List    STOP taking these medications   omeprazole 40 MG capsule Commonly known as: PRILOSEC     TAKE these medications   acetaminophen 500 MG tablet Commonly known as: TYLENOL Take 2 tablets (1,000 mg total) by mouth every 8 (eight) hours as needed for mild pain or fever. What changed:   when to take this  reasons to take this   ALPRAZolam 0.5 MG tablet Commonly known as: XANAX Take 0.5 mg by mouth daily as needed for anxiety.   aMILoride 5 MG tablet Commonly known as: MIDAMOR Take 5 mg by mouth daily.   amLODipine 10 MG tablet Commonly known as: NORVASC Take 10 mg by mouth daily.   aspirin 81 MG EC tablet Take 81 mg by mouth daily.   atorvastatin 40 MG tablet Commonly known as: LIPITOR Take 1 tablet by mouth daily.   glipiZIDE 5 MG  24 hr tablet Commonly known as: GLUCOTROL XL Take 5 mg by mouth daily.   labetalol 300 MG tablet Commonly known as: NORMODYNE Take 300 mg by mouth 2 (two) times daily.   lisinopril 40 MG tablet Commonly known as: ZESTRIL Take 40 mg by mouth daily.   rosuvastatin 10 MG tablet Commonly known as: CRESTOR Take 10 mg by mouth every other day.   tadalafil 20 MG tablet Commonly known as: CIALIS Take 20 mg by mouth See admin instructions. Takes 20 mg every three days.   traMADol 50 MG tablet Commonly known as: ULTRAM Take 1-2 tablets (50-100 mg total) by mouth every 6 (six) hours as needed for moderate pain or severe pain.            Discharge Care Instructions  (From admission, onward)         Start     Ordered   01/27/21 0000  No dressing needed        01/27/21 1055  Consultations:  General surgery  Procedures/Studies:  01/26/2021- laparoscopic cholecystectomy with intraoperative cholangiogram, and core liver biopsy   DG Cholangiogram Operative  Result Date: 01/26/2021 CLINICAL DATA:  Cholecystectomy for cholelithiasis and cholecystitis. EXAM: INTRAOPERATIVE CHOLANGIOGRAM TECHNIQUE: Cholangiographic images from the C-arm fluoroscopic device were submitted for interpretation post-operatively. Please see the procedural report for the amount of contrast and the fluoroscopy time utilized. COMPARISON:  CT of the abdomen and pelvis on 01/25/2021 FINDINGS: Intraoperative imaging with a C-arm demonstrates normal opacified bile ducts without evidence of filling defect or obstruction. Contrast is seen within the duodenum. No contrast extravasation. IMPRESSION: Unremarkable intraoperative cholangiogram. Electronically Signed   By: Aletta Edouard M.D.   On: 01/26/2021 13:37   CT ABDOMEN PELVIS W CONTRAST  Result Date: 01/25/2021 CLINICAL DATA:  Epigastric pain/abdominal pain. Concern for pancreatitis. EXAM: CT ABDOMEN AND PELVIS WITH CONTRAST TECHNIQUE: Multidetector CT  imaging of the abdomen and pelvis was performed using the standard protocol following bolus administration of intravenous contrast. CONTRAST:  93mL OMNIPAQUE IOHEXOL 300 MG/ML  SOLN COMPARISON:  06/25/2019 FINDINGS: Lower chest: Linear scarring at the left base. Heart is borderline in size. No effusions. Hepatobiliary: Cholelithiasis.  No focal hepatic abnormality. Pancreas: No focal abnormality or ductal dilatation. No surrounding inflammation. Spleen: No focal abnormality.  Normal size. Adrenals/Urinary Tract: No adrenal abnormality. No focal renal abnormality. No stones or hydronephrosis. Urinary bladder is unremarkable. Stomach/Bowel: Normal appendix. Stomach, large and small bowel grossly unremarkable. Vascular/Lymphatic: Aortic atherosclerosis. No evidence of aneurysm or adenopathy. Reproductive: Lower pelvic structures obscured by beam hardening artifact from bilateral hip replacements. Other: No free fluid or free air. Musculoskeletal: Bilateral hip replacements. No acute bony abnormality. Lucent lesion within the left iliac bone with surrounding sclerosis is again noted and unchanged since prior study. This has a benign appearance. IMPRESSION: Cholelithiasis. No CT evidence of pancreatitis. Aortic atherosclerosis.  Borderline cardiomegaly. No acute findings. Electronically Signed   By: Rolm Baptise M.D.   On: 01/25/2021 14:58   DG Chest Port 1 View  Result Date: 01/25/2021 CLINICAL DATA:  Chest pain EXAM: PORTABLE CHEST 1 VIEW COMPARISON:  06/25/2019 FINDINGS: Cardiac enlargement. Negative for heart failure or edema. No significant pleural effusion. Negative for pneumonia. IMPRESSION: Cardiac enlargement.  No acute abnormality. Electronically Signed   By: Franchot Gallo M.D.   On: 01/25/2021 12:33   US Abdomen Limited RUQ (LIVER/GB)  Result Date: 01/25/2021 CLINICAL DATA:  Right upper abdominal pain with nausea and vomiting EXAM: ULTRASOUND ABDOMEN LIMITED RIGHT UPPER QUADRANT COMPARISON:  CT  abdomen and pelvis June 25, 2019 FINDINGS: Gallbladder: Within the gallbladder, there are echogenic foci which move and shadow consistent with cholelithiasis. Largest gallstone measures 1.6 cm in length. Gallbladder wall thickness is upper normal. No pericholecystic fluid. Patient is focally tender over the gallbladder. Common bile duct: Diameter: 3 mm. No intrahepatic or extrahepatic biliary duct dilatation. Liver: No focal lesion identified. Within normal limits in parenchymal echogenicity. Portal vein is patent on color Doppler imaging with normal direction of blood flow towards the liver. Other: None. IMPRESSION: Cholelithiasis with gallbladder wall thickness upper normal. Patient is focally tender over the gallbladder. These findings raise concern for potential degree of acute cholecystitis. This finding may warrant nuclear medicine hepatobiliary gene study to assess for cystic duct patency. Study otherwise unremarkable. Electronically Signed   By: Lowella Grip III M.D.   On: 01/25/2021 11:53        The results of significant diagnostics from this hospitalization (including imaging, microbiology, ancillary and laboratory) are listed  below for reference.     Microbiology: Recent Results (from the past 240 hour(s))  Resp Panel by RT-PCR (Flu A&B, Covid) Nasopharyngeal Swab     Status: None   Collection Time: 01/25/21  3:40 PM   Specimen: Nasopharyngeal Swab; Nasopharyngeal(NP) swabs in vial transport medium  Result Value Ref Range Status   SARS Coronavirus 2 by RT PCR NEGATIVE NEGATIVE Final    Comment: (NOTE) SARS-CoV-2 target nucleic acids are NOT DETECTED.  The SARS-CoV-2 RNA is generally detectable in upper respiratory specimens during the acute phase of infection. The lowest concentration of SARS-CoV-2 viral copies this assay can detect is 138 copies/mL. A negative result does not preclude SARS-Cov-2 infection and should not be used as the sole basis for treatment or other  patient management decisions. A negative result may occur with  improper specimen collection/handling, submission of specimen other than nasopharyngeal swab, presence of viral mutation(s) within the areas targeted by this assay, and inadequate number of viral copies(<138 copies/mL). A negative result must be combined with clinical observations, patient history, and epidemiological information. The expected result is Negative.  Fact Sheet for Patients:  EntrepreneurPulse.com.au  Fact Sheet for Healthcare Providers:  IncredibleEmployment.be  This test is no t yet approved or cleared by the [place] FDA and  has been authorized for detection and/or diagnosis of SARS-CoV-2 by FDA under an Emergency Use Authorization (EUA). This EUA will remain  in effect (meaning this test can be used) for the duration of the COVID-19 declaration under Section 564(b)(1) of the Act, 21 U.S.C.section 360bbb-3(b)(1), unless the authorization is terminated  or revoked sooner.       Influenza A by PCR NEGATIVE NEGATIVE Final   Influenza B by PCR NEGATIVE NEGATIVE Final    Comment: (NOTE) The Xpert Xpress SARS-CoV-2/FLU/RSV plus assay is intended as an aid in the diagnosis of influenza from Nasopharyngeal swab specimens and should not be used as a sole basis for treatment. Nasal washings and aspirates are unacceptable for Xpert Xpress SARS-CoV-2/FLU/RSV testing.  Fact Sheet for Patients: EntrepreneurPulse.com.au  Fact Sheet for Healthcare Providers: IncredibleEmployment.be  This test is not yet approved or cleared by the [place] FDA and has been authorized for detection and/or diagnosis of SARS-CoV-2 by FDA under an Emergency Use Authorization (EUA). This EUA will remain in effect (meaning this test can be used) for the duration of the COVID-19 declaration under Section 564(b)(1) of the Act, 21 U.S.C. section  360bbb-3(b)(1), unless the authorization is terminated or revoked.  Performed at [facility], [address] [place], [place] [address]      Labs:  CBC: Recent Labs  Lab 01/25/21 1056 01/26/21 0422 01/27/21 0433  WBC 5.4 7.5 7.6  HGB 14.5 13.5 13.4  HCT 43.7 40.9 40.3  MCV 87.8 89.3 89.2  PLT 194 188 179   BMP &GFR Recent Labs  Lab 01/25/21 1056 01/26/21 0422 01/27/21 0433  NA 137 139 138  K 3.8 3.6 3.8  CL 102 104 104  CO2 25 24 28   GLUCOSE 158* 100* 112*  BUN 23* 19 22*  CREATININE 1.91* 1.94* 2.08*  CALCIUM 9.2 8.8* 9.0  MG  --   --  1.9  PHOS  --   --  3.9   Estimated Creatinine Clearance: 59.8 mL/min (A) (by C-G formula based on SCr of 2.08 mg/dL (H)). Liver & Pancreas: Recent Labs  Lab 01/25/21 1056 01/26/21 0422 01/27/21 0433  AST 29 24 49*  ALT 40 33 52*  ALKPHOS  66 61 63  BILITOT 0.6 0.7 0.6  PROT 8.2* 6.7 7.0  ALBUMIN 4.5 3.4* 3.7   Recent Labs  Lab 01/25/21 1056 01/27/21 0433  LIPASE 58* 30   No results for input(s): AMMONIA in the last 168 hours. Diabetic: Recent Labs    01/26/21 0422 01/27/21 0433  HGBA1C 6.3* 6.3*   Recent Labs  Lab 01/26/21 0956 01/26/21 1328 01/26/21 1658 01/26/21 2013 01/27/21 0732  GLUCAP 106* 123* 137* 121* 128*   Cardiac Enzymes: No results for input(s): CKTOTAL, CKMB, CKMBINDEX, TROPONINI in the last 168 hours. No results for input(s): PROBNP in the last 8760 hours. Coagulation Profile: No results for input(s): INR, PROTIME in the last 168 hours. Thyroid Function Tests: No results for input(s): TSH, T4TOTAL, FREET4, T3FREE, THYROIDAB in the last 72 hours. Lipid Profile: Recent Labs    01/27/21 0433  CHOL 148  HDL 62  LDLCALC 75  TRIG 55  CHOLHDL 2.4   Anemia Panel: No results for input(s): VITAMINB12, FOLATE, FERRITIN, TIBC, IRON, RETICCTPCT in the last 72 hours. Urine analysis:    Component Value Date/Time   COLORURINE YELLOW 01/25/2021 1056   APPEARANCEUR CLEAR  01/25/2021 1056   LABSPEC 1.020 01/25/2021 1056   PHURINE 8.0 01/25/2021 1056   GLUCOSEU 100 (A) 01/25/2021 1056   HGBUR SMALL (A) 01/25/2021 1056   BILIRUBINUR NEGATIVE 01/25/2021 [place] 01/25/2021 1056   PROTEINUR 30 (A) 01/25/2021 1056   NITRITE NEGATIVE 01/25/2021 1056   LEUKOCYTESUR NEGATIVE 01/25/2021 1056   Sepsis Labs: Invalid input(s): PROCALCITONIN, LACTICIDVEN   Time coordinating discharge: 35 minutes  SIGNED:  Mercy Riding, MD  [facility] 01/27/2021, 10:57 AM  If 7PM-7AM, please contact night-coverage [email or web address]

## 2022-04-02 IMAGING — US US ABDOMEN LIMITED RUQ/ASCITES
1 series · 14 of 25 positions shown · non-contrast
Comparison: CT abdomen and pelvis June 25, 2019

CLINICAL DATA: Right upper abdominal pain with nausea and vomiting

EXAM:
ULTRASOUND ABDOMEN LIMITED RIGHT UPPER QUADRANT

[Series 1: us abdomen limited ruq/ascites · 14 of 39 slices shown]
[im 1/39]
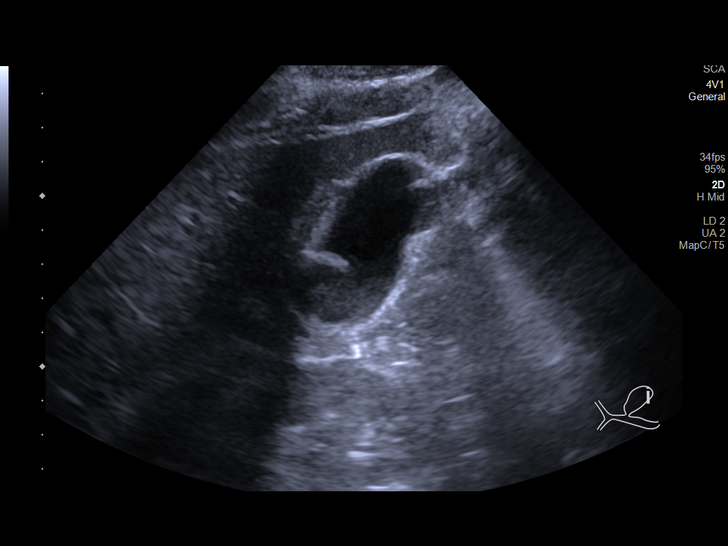
[im 4/39]
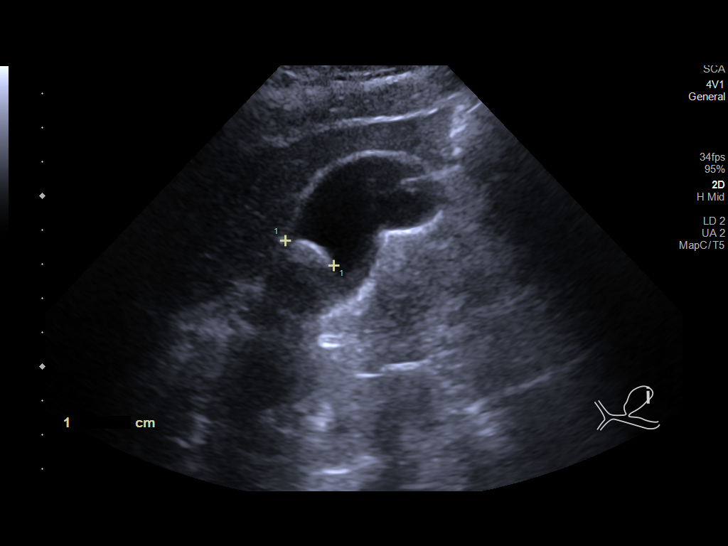
[im 7/39]
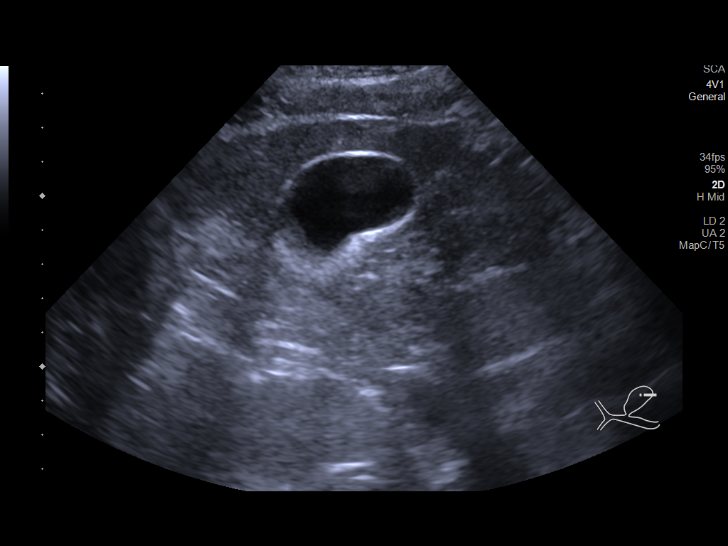
[im 10/39]
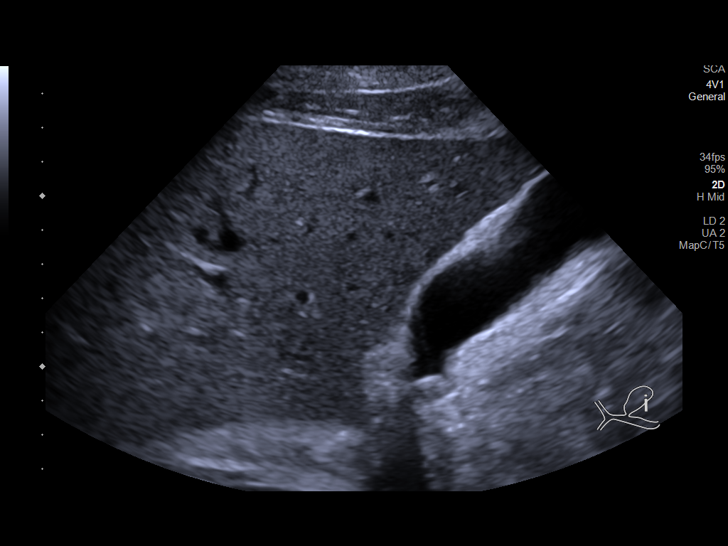
[im 13/39]
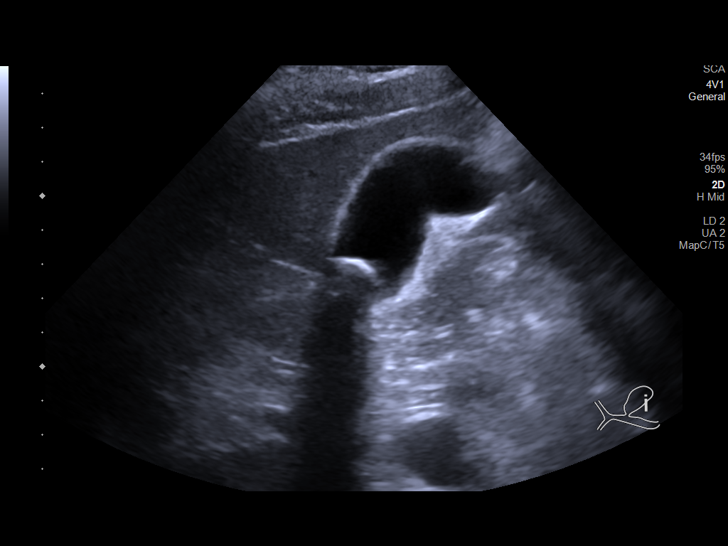
[im 15/39]
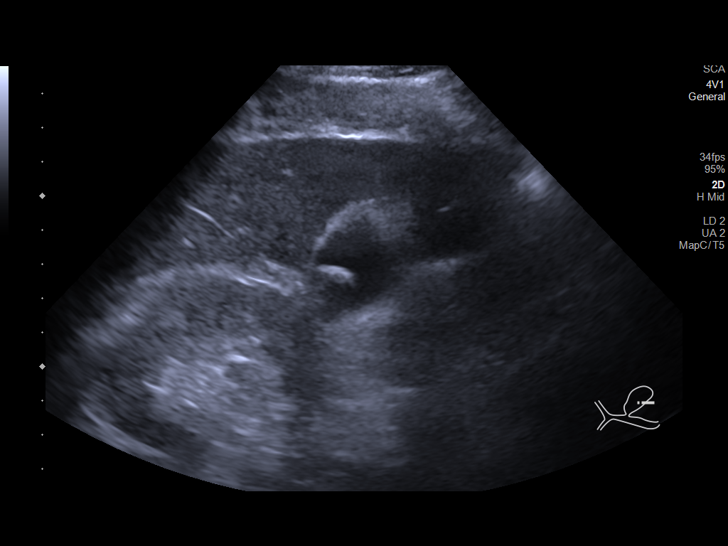
[im 18/39]
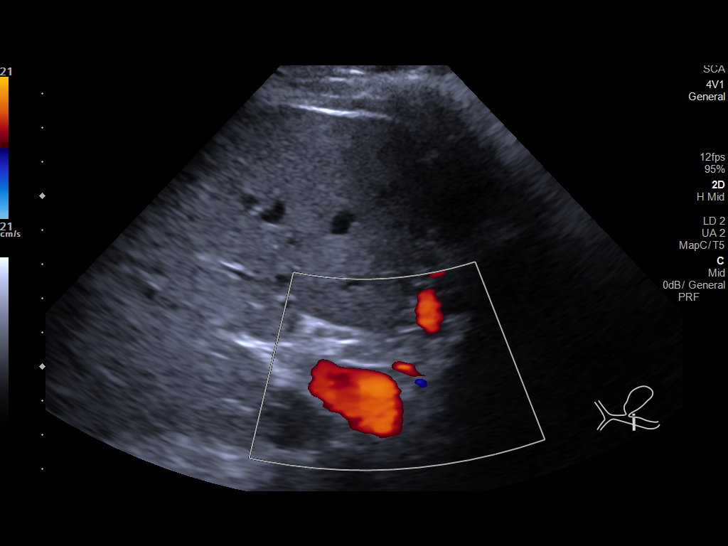
[im 21/39]
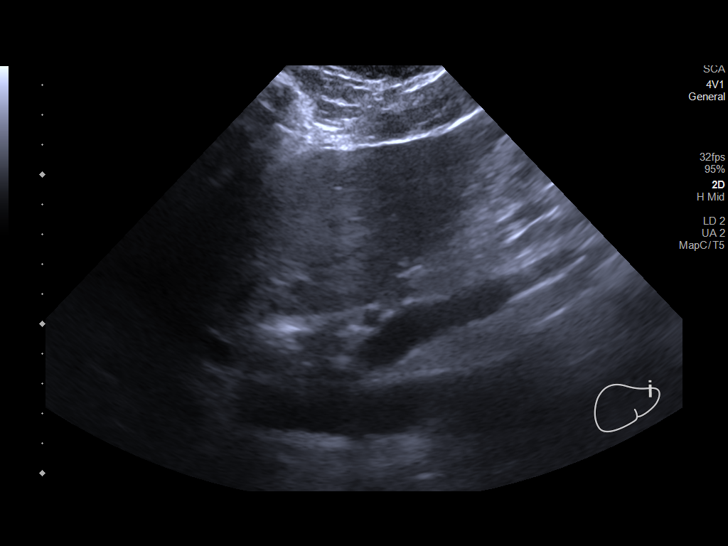
[im 24/39]
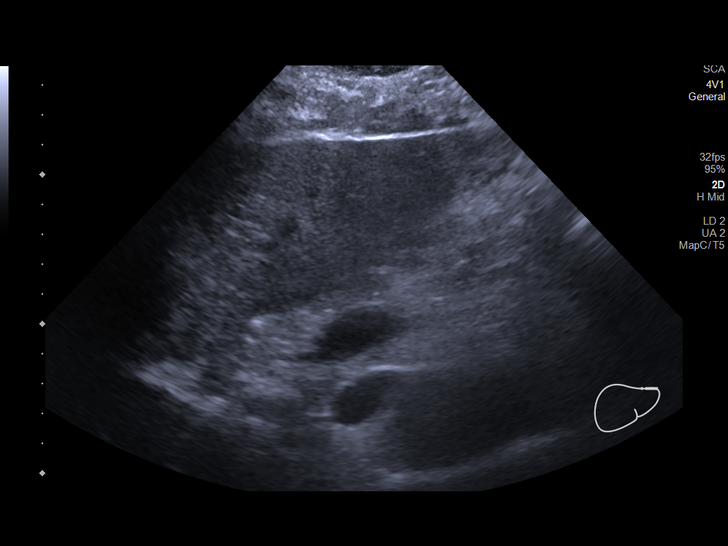
[im 26/39]
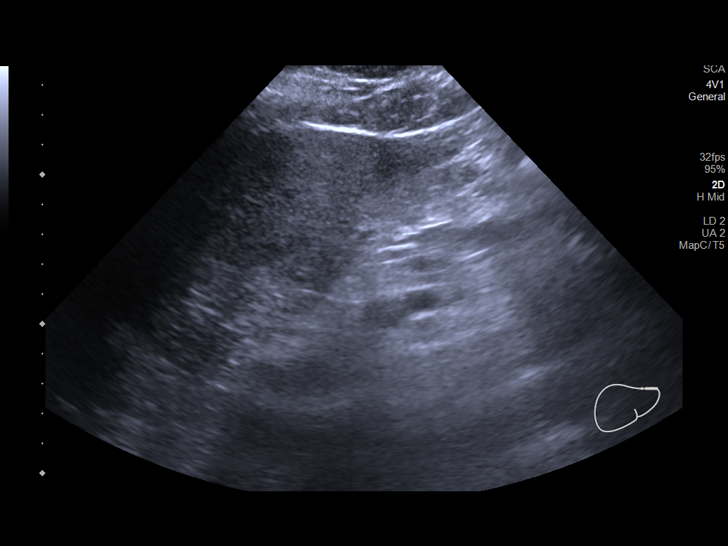
[im 29/39]
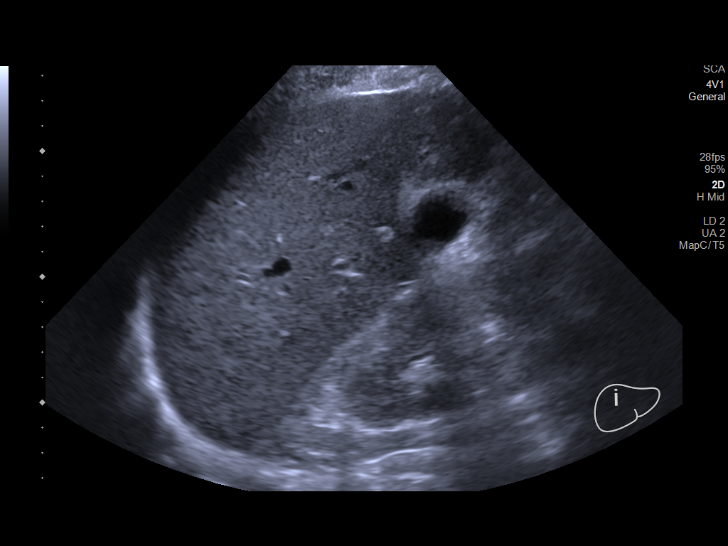
[im 32/39]
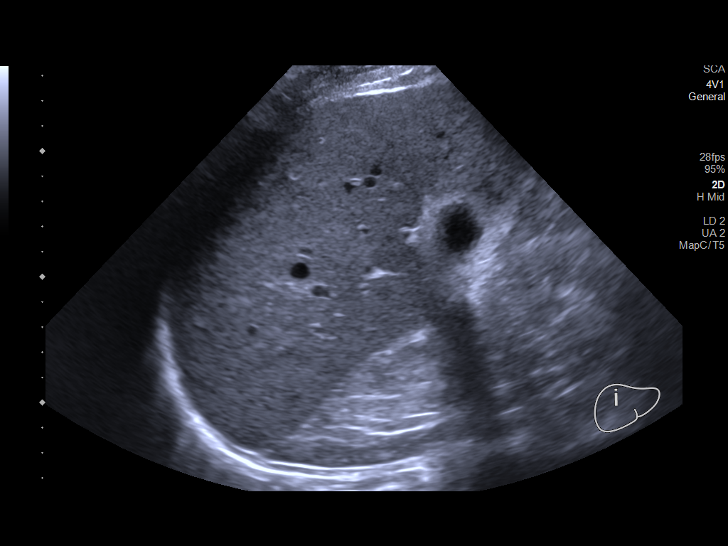
[im 35/39]
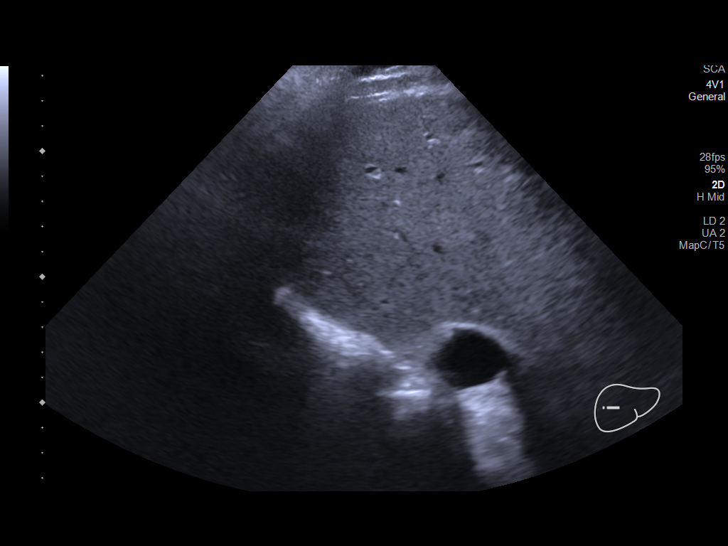
[im 39/39]
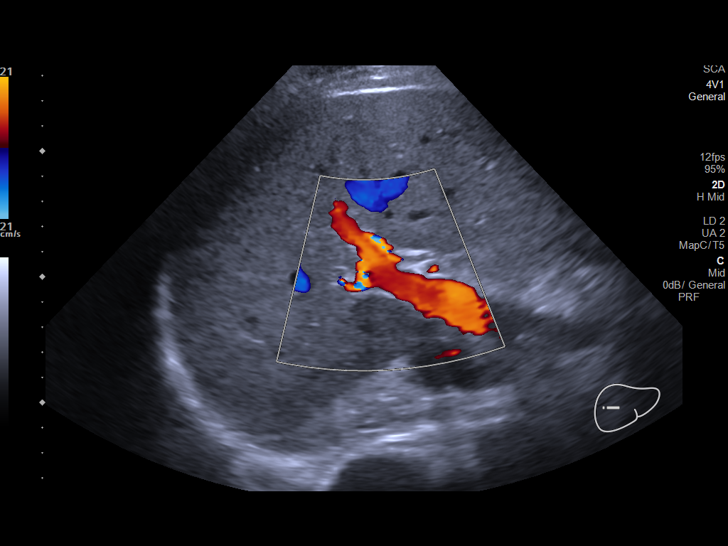

[14 of 25 positions shown; findings below may reference images not displayed]

FINDINGS: Gallbladder:

Within the gallbladder, there are echogenic foci which move and
shadow consistent with cholelithiasis. Largest gallstone measures
1.6 cm in length. Gallbladder wall thickness is upper normal. No
pericholecystic fluid. Patient is focally tender over the
gallbladder.

Common bile duct:

Diameter: 3 mm. No intrahepatic or extrahepatic biliary duct
dilatation.

Liver:

No focal lesion identified. Within normal limits in parenchymal
echogenicity. Portal vein is patent on color Doppler imaging with
normal direction of blood flow towards the liver.

Other: None.
IMPRESSION: Cholelithiasis with gallbladder wall thickness upper normal. Patient
is focally tender over the gallbladder. These findings raise concern
for potential degree of acute cholecystitis. This finding may
warrant nuclear medicine hepatobiliary gene study to assess for
cystic duct patency.

Study otherwise unremarkable.
